# Patient Record
Sex: Male | Born: 1975 | Race: White | Hispanic: No | Marital: Married | Smoking: Former smoker
Health system: Southern US, Community
[De-identification: ages and names within clinical notes are randomized; demographics above are authoritative.]

## PROBLEM LIST (undated history)

## (undated) DIAGNOSIS — F419 Anxiety disorder, unspecified: Secondary | ICD-10-CM

## (undated) DIAGNOSIS — I517 Cardiomegaly: Secondary | ICD-10-CM

## (undated) DIAGNOSIS — K219 Gastro-esophageal reflux disease without esophagitis: Secondary | ICD-10-CM

## (undated) DIAGNOSIS — F32A Depression, unspecified: Secondary | ICD-10-CM

## (undated) DIAGNOSIS — I340 Nonrheumatic mitral (valve) insufficiency: Secondary | ICD-10-CM

## (undated) DIAGNOSIS — E8801 Alpha-1-antitrypsin deficiency: Secondary | ICD-10-CM

## (undated) DIAGNOSIS — T7840XA Allergy, unspecified, initial encounter: Secondary | ICD-10-CM

## (undated) DIAGNOSIS — J449 Chronic obstructive pulmonary disease, unspecified: Secondary | ICD-10-CM

## (undated) DIAGNOSIS — F329 Major depressive disorder, single episode, unspecified: Secondary | ICD-10-CM

## (undated) DIAGNOSIS — R0600 Dyspnea, unspecified: Secondary | ICD-10-CM

## (undated) DIAGNOSIS — I10 Essential (primary) hypertension: Secondary | ICD-10-CM

## (undated) DIAGNOSIS — G43909 Migraine, unspecified, not intractable, without status migrainosus: Secondary | ICD-10-CM

## (undated) HISTORY — PX: EYE SURGERY: SHX253

## (undated) HISTORY — DX: Essential (primary) hypertension: I10

## (undated) HISTORY — PX: HERNIA REPAIR: SHX51

## (undated) HISTORY — DX: Allergy, unspecified, initial encounter: T78.40XA

## (undated) HISTORY — DX: Major depressive disorder, single episode, unspecified: F32.9

## (undated) HISTORY — DX: Migraine, unspecified, not intractable, without status migrainosus: G43.909

## (undated) HISTORY — DX: Depression, unspecified: F32.A

## (undated) HISTORY — DX: Gastro-esophageal reflux disease without esophagitis: K21.9

---

## 2002-06-18 ENCOUNTER — Emergency Department (HOSPITAL_COMMUNITY): Admission: EM | Admit: 2002-06-18 | Discharge: 2002-06-18 | Payer: Self-pay | Admitting: Emergency Medicine

## 2010-11-22 HISTORY — PX: CHOLECYSTECTOMY: SHX55

## 2010-11-22 HISTORY — PX: ROUX-EN-Y PROCEDURE: SUR1287

## 2011-11-23 HISTORY — PX: STRABISMUS SURGERY: SHX218

## 2013-05-17 ENCOUNTER — Ambulatory Visit: Payer: Self-pay | Admitting: Ophthalmology

## 2013-05-17 LAB — CREATININE, SERUM
Creatinine: 0.88 mg/dL (ref 0.60–1.30)
EGFR (African American): >60
EGFR (Non-African Amer.): >60

## 2014-01-26 ENCOUNTER — Ambulatory Visit: Payer: Self-pay | Admitting: Orthopedic Surgery

## 2014-01-26 ENCOUNTER — Inpatient Hospital Stay: Payer: Self-pay | Admitting: Orthopedic Surgery

## 2014-01-26 LAB — COMPREHENSIVE METABOLIC PANEL
ALBUMIN: 3.4 g/dL (ref 3.4–5.0)
ALK PHOS: 143 U/L — AB
ALT: 82 U/L — AB (ref 12–78)
Anion Gap: 3 — ABNORMAL LOW (ref 7–16)
BUN: 15 mg/dL (ref 7–18)
Bilirubin,Total: 0.3 mg/dL (ref 0.2–1.0)
CALCIUM: 8.5 mg/dL (ref 8.5–10.1)
CREATININE: 0.91 mg/dL (ref 0.60–1.30)
Chloride: 108 mmol/L — ABNORMAL HIGH (ref 98–107)
Co2: 28 mmol/L (ref 21–32)
EGFR (African American): >60
EGFR (Non-African Amer.): >60
Glucose: 71 mg/dL (ref 65–99)
Osmolality: 277 (ref 275–301)
Potassium: 4 mmol/L (ref 3.5–5.1)
SGOT(AST): 60 U/L — ABNORMAL HIGH (ref 15–37)
Sodium: 139 mmol/L (ref 136–145)
Total Protein: 6.7 g/dL (ref 6.4–8.2)

## 2014-01-26 LAB — CBC WITH DIFFERENTIAL/PLATELET
BASOS ABS: 0.3 10*3/uL — AB (ref 0.0–0.1)
Basophil %: 2.3 %
EOS PCT: 0.7 %
Eosinophil #: 0.1 10*3/uL (ref 0.0–0.7)
HCT: 43.3 % (ref 40.0–52.0)
HGB: 15 g/dL (ref 13.0–18.0)
LYMPHS PCT: 11.1 %
Lymphocyte #: 1.4 10*3/uL (ref 1.0–3.6)
MCH: 30.5 pg (ref 26.0–34.0)
MCHC: 34.7 g/dL (ref 32.0–36.0)
MCV: 88 fL (ref 80–100)
Monocyte #: 0.9 x10 3/mm (ref 0.2–1.0)
Monocyte %: 7.1 %
Neutrophil #: 9.7 10*3/uL — ABNORMAL HIGH (ref 1.4–6.5)
Neutrophil %: 78.8 %
Platelet: 231 10*3/uL (ref 150–440)
RBC: 4.94 10*6/uL (ref 4.40–5.90)
RDW: 14.2 % (ref 11.5–14.5)
WBC: 12.3 10*3/uL — AB (ref 3.8–10.6)

## 2014-01-28 LAB — CBC WITH DIFFERENTIAL/PLATELET
BASOS PCT: 0.5 %
Basophil #: 0 10*3/uL (ref 0.0–0.1)
Eosinophil #: 0.1 10*3/uL (ref 0.0–0.7)
Eosinophil %: 1.3 %
HCT: 39.4 % — ABNORMAL LOW (ref 40.0–52.0)
HGB: 13.5 g/dL (ref 13.0–18.0)
Lymphocyte #: 1.7 10*3/uL (ref 1.0–3.6)
Lymphocyte %: 19.1 %
MCH: 30.1 pg (ref 26.0–34.0)
MCHC: 34.1 g/dL (ref 32.0–36.0)
MCV: 88 fL (ref 80–100)
MONOS PCT: 11.8 %
Monocyte #: 1 x10 3/mm (ref 0.2–1.0)
NEUTROS PCT: 67.3 %
Neutrophil #: 6 10*3/uL (ref 1.4–6.5)
Platelet: 207 10*3/uL (ref 150–440)
RBC: 4.46 10*6/uL (ref 4.40–5.90)
RDW: 13.5 % (ref 11.5–14.5)
WBC: 8.9 10*3/uL (ref 3.8–10.6)

## 2014-01-28 LAB — VANCOMYCIN, TROUGH: Vancomycin, Trough: 18 ug/mL (ref 10–20)

## 2014-01-30 LAB — WOUND CULTURE

## 2014-01-31 LAB — CULTURE, BLOOD (SINGLE)

## 2015-03-15 NOTE — Op Note (Signed)
PATIENT NAME:  Derrick Doyle, Derrick Doyle MR#:  [id number] DATE OF BIRTH:  Nov 09, 1976  DATE OF PROCEDURE:  01/26/2014  PREOPERATIVE DIAGNOSES:  1.  Right hand abscess/middle finger flexor tenosynovitis.  2.  Acute carpal tunnel syndrome, right hand.   POSTOPERATIVE DIAGNOSES: 1.  Right hand abscess/middle finger flexor tenosynovitis.  2.  Acute carpal tunnel syndrome, right hand.   PROCEDURE PERFORMED:  1,  Right hand/middle finger incision and drainage.  2.  Open carpal tunnel release.   SURGEON: Maebelle Munroe, M.D.   ASSISTANT: None.   COMPLICATIONS: None apparent.   ESTIMATED BLOOD LOSS: Less than 20 mL.   ANESTHESIA: General.   MATERIALS TO LAB: Anaerobic, aerobic cultures and Gram stain x 2.   OPERATIVE FINDINGS: Moderate purulence flexor tendon sheath and minimal purulence carpal tunnel.   INDICATIONS: Mr. Bougie is a 39 year old male, who approximately 10 days ago underwent an office-based trigger finger release by Dr. Tamala Julian. Unfortunately, the patient  developed redness and swelling, which upon an examination in the Emergency Department was consistent with flexor tenosynovitis of the middle finger and hand abscess with all Knavel's signs present 4 out of 4 and acute carpal tunnel syndrome; risks, benefits and alternatives were discussed with the patient and his wife to include, but not limited to bleeding, infection, damage to blood vessels and nerves, need for further surgery and treatment, chronic pain, loss of function, stiffness, allergy, anesthetic risk, recurrence of infection and hand stiffness. He appeared to understand the risks, benefits and desired to proceed with operative treatment.   DESCRIPTION OF PROCEDURE: After positive identification of the patient in the preoperative holding and after informed consent had been obtained, the correct operative site had been initialed by myself. The patient was taken to the operating room and placed in the supine position. IV  antibiotics were held. Timeout was performed. Sequential compression devices were applied to bilateral lower extremities. The patient started a general anesthesia. The tourniquet was placed on the right brachium. The right upper extremity was prepped and draped in the usual sterile fashion. The arm was elevated with elevation and exsanguination. Tourniquet pressure was set to 250 mmHg desufflated at the end of procedure for a total tourniquet time of 90 minutes. The carpal tunnel release was performed initially given the presence of acute carpal tunnel syndrome. A longitudinal incision was made at the junction of the middle and ring finger ray proximal to Kaplan's line. Incision was deepened in the Gantt fascia, which was incised in line with the skin incision. This was then deepened to the transverse carpal ligament. The distal most aspect of the transverse carpal ligament was identified and confirmed with the peri-arterial fat of the superficial palmar arch. The transverse carpal ligament was then released from distal to proximal. The median nerve was identified and protected throughout the procedure. This was released under direct visualization to the point of the distal forearm fascia. Attention was then directed toward the trigger finger incision, which was then extended proximally using a Brunner style incision and distally using a Brunner incision over the middle and distal phalanx using an ulnar base.   The Soil scientist was then used to connect the carpal tunnel with the trigger finger incision where moderate purulence was noted. The purulence was noted to exude also from the distal phalanx. Bruner incision was extended to the distal phalanx. I used a 5-French pediatric feeding tube with 22-gauge surgical wire in an attempt to pass the pediatric feeding tube with slits in it to  irrigate the carpal tunnel and flexor tendon sheath. Given the inability to pass, I was able to pass the catheter from the  carpal tunnel incision through the trigger finger incision, but not into the finger incision. The Brunner incision was then extended to the trigger finger incision and out onto the distal phalanx. The pediatric feeding tube with slits in it was then utilized for sprinkler type irrigation in the flexor tendon sheath along with bulb irrigation overall into the wound. Approximately 1500 mL of fluid were utilized. Hemostasis was obtained. The radial and ulnar digital nerves were identified in the middle finger prior to incision of the flexor tendon sheath, which was mainly over the A3 and A5 pulleys. The Brunner incision was closed with 3-0 nylon in a running fashion. The carpal tunnel was closed in a standard fashion and the skin with 3-0 nylon. The incision over the initial trigger finger release was loosely closed. The devitalized, infected skin was debrided, especially from the distal portion of the incision. Xeroform dressings with 4 x 4 and gauze along with dorsal splint were then placed. The tourniquet was deflated. The patient was taken to the recovery room in satisfactory condition after extubation without apparent operative anesthetic complications. These findings were related to the patient's wife as well as his need for followup, compliance and IV antibiotics.    ____________________________ Maebelle Munroe, MD jfs:aw D: 01/27/2014 00:02:46 ET T: 01/27/2014 11:38:29 ET JOB#: [id number]  cc: Maebelle Munroe, MD, <Dictator> Maebelle Munroe MD ELECTRONICALLY SIGNED 01/27/2014 19:59

## 2015-03-15 NOTE — Discharge Summary (Signed)
PATIENT NAME:  Derrick Doyle, Derrick Doyle MR#:  [id number] DATE OF BIRTH:  11-Apr-1976  DATE OF ADMISSION:  01/26/2014 DATE OF DISCHARGE:  01/29/2014  ADMITTING DIAGNOSES:  1. Right hand abscess/middle finger flexor tendinitis. 2. Acute carpal tunnel syndrome felt to be secondary to abscess pressure from the flexor tendon sheath.   DISCHARGE DIAGNOSES:  1. Right hand abscess/middle finger flexor tendinitis. 2. Acute carpal tunnel syndrome felt to be secondary to abscess pressure from the flexor tendon sheath.   HISTORY OF PRESENT ILLNESS: The patient is a 39 year old who presented to the Emergency Room having increased discomfort to the right-hand along with swelling. 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 Margaretmary Eddy. The patient was seen in the office on the Thursday prior to admission, at that time, the concern was that  he had an infection. He was placed on Keflex. He continued to have pain and swelling and contacted [employer] on Friday. He then was seen in the emergency room on Saturday due to the increased pain and swelling. Dr. Christophe Louis was contacted by the Emergency Room  physician and was advised that he needed to come in the office Monday morning. Subsequently, the Emergency Room doctor contacted the locum doctor on call, Dr. Mardi Mainland, who saw the patient and felt that he needed emergency surgery. He was therefore admitted in the orthopedic department and underwent surgery.   PROCEDURE:  1. Right hand middle finger incision and drainage.  2. Open carpal tunnel release:   ANESTHESIA: General.   HOSPITAL COURSE: The patient tolerated the procedure very well. He had no complications. He was then taken to the PAC-U where he was stabilized and then transferred to the orthopedic floor. The patient was placed on cephalexin, as well as Unasyn. He continue to improve.  He still had a lot of of discomfort and swelling. He was afebrile throughout the hospital course. Cultures and sensitivities were performed and the patient is discharged after sensitivity was obtained. He was noted to be be sensitive to Bactrim DS and subsequently he was discharged to home in improved condition with this. He is placed on a Bactrim DS for two weeks. He will need to follow-up in the clinic in one week. Please call the clinic sooner if any temperatures to 101.5 or greater or excessive bleeding or increase in numbness and tingling or burning sensation. He was instructed in elevation of the right arm. Elevate the arm as much as possible. Also apply ice. He is to keep the wound clean and dry. He is to resume his regular medications that he was on prior to admission. He was given a prescription for oxycodone 5 to 10 mg every 4 to 6 hours p.r.n. for pain, as well as Tramadol 50 to 100 mg every 4 to 6 hours p.r.n. for pain. He is placed on a regular diet.   Past medical history is positive for anxiety disorder, depression, and hypertension.  ____________________________ Vance Peper, PA jrw:sg D: 01/29/2014 12:20:37 ET T: 01/29/2014 12:50:55 ET JOB#: [id number]  cc: Vance Peper, PA, <Dictator> JON WOLFE PA ELECTRONICALLY SIGNED 02/07/2014 18:33

## 2015-03-15 NOTE — H&P (Signed)
PATIENT NAME:  Derrick Doyle, Derrick Doyle MR#:  [id number] DATE OF BIRTH:  12/17/1975  DATE OF ADMISSION:  01/26/2014  CHIEF COMPLAINT:  Right hand pain.   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 Margaretmary Eddy.  He states that he was seen in the office by Dr. Tamala Julian on Thursday.  At that time he had swelling of the hand as well as pain which ultimately progressed yesterday and per the patient's history they telephoned the office and he was told to take Keflex.    The patient's pain, swelling and redness progressed today causing him to present to the Emergency Department.  I was contacted by Dr. Jasmine December to see the patient.  The patient complains of sharp, near constant, throbbing pain in the right hand as well as drainage.   PAST MEDICAL HISTORY:  Remarkable for anxiety disorder, depression, and hypertension.   PAST SURGICAL HISTORY:  Remarkable for gastric bypass, office-based trigger finger release.   CURRENT MEDICATIONS:  Include sertraline, keflex, lamotrigine, losartan, mobic, trazodone  CURRENT ALLERGIES:  Include no known drug allergies.   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  He is right-hand dominant.  He is in the process of quitting smoking.  He is smoking approximately three cigarettes per day.  Occasional alcohol use.   PHYSICAL EXAMINATION: GENERAL:  Pleasant, alert male appearing his stated age, presenting with his wife.  PSYCHIATRIC:  Mood and affect appropriate.  HEENT:  Normocephalic, atraumatic.  Sclerae clear.  Oral mucosa membranes are moist.  VITAL SIGNS:  On presentation to the Emergency Department, temperature of 98.2, pulse of 108, blood pressure 176/102, 99% room air saturation.  LUNGS:  Clear to auscultation bilaterally.  HEART:  Regular rhythm, increased rate, no murmurs or gallops.   ABDOMEN:  Soft.  Positive bowel sounds.  LYMPHATIC:  Moderate swelling.   RIGHT HAND SKIN EXAMINATION:  Skin defect over the surgical site and the palm with diffuse erythema and swelling of the right ring finger ray.  VASCULAR:  Less than 2 second capillary refill right middle finger.  NEUROLOGIC:  Shows diffuse loss of light touch sensation predominantly in the median nerve distribution.  Motor function is intact.  There is also decreased light touch sensation in the ulnar nerve distribution as well to a much lesser degree.   MUSCULOSKELETAL:  Marked fusiform swelling of the right middle finger with erythema and flex posture of his finger with significant pain with any extension of the middle finger.  There is marked tenderness to palpation both over the carpal tunnel as well as the entire course of the middle finger ray volarly including the proximal middle and distal phalanx as well as over the PIP and DIPJ.  Surgical site itself shows emacerated skin with expressible cloudy discharge and severe tenderness to palpation in this region.  Knavel's signs are all presnent DIAGNOSTIC STUDIES:  Laboratory evaluation shows normal chemistries and slightly elevated ALT and AST.  On CBC white blood cell count increased at 12.3 with a left shift of 78.8% neutrophils.  Radiographs are unremarkable except for soft tissue swelling in the right hand.   IMPRESSION:  1.  Right hand abscess/middle finger suppurative flexor tenosynovitis postoperative.  2.  Acute carpal tunnel syndrome, most likely secondary to abscess pressure from the       flexor tendon sheath.   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  Risks, benefits and alternatives were discussed with the patient to include, but not limited to bleeding, infection, damage to blood vessels and nerves, need for surgery and  treatment, chronic pain, loss of function, stiffness, allergy, anesthetic risk, recurrence of infection, stiffness, heart, lung, brain, kidney complications.  The patient and his wife appeared to understand the risks and benefits and desired to proceed with operative treatment.  The patient has received IV antibiotics here in the Emergency Department.  We will also add a C-reactive protein to his lab draw and the patient and his wife were informed that he will need to be admitted for IV antibiotics following his procedure.      ____________________________ Maebelle Munroe, MD jfs:ea D: 01/26/2014 20:29:18 ET T: 01/26/2014 23:51:46 ET JOB#: [id number]  cc: Maebelle Munroe, MD, <Dictator> Maebelle Munroe MD ELECTRONICALLY SIGNED 01/27/2014 19:57

## 2015-06-16 ENCOUNTER — Ambulatory Visit
Admission: RE | Admit: 2015-06-16 | Discharge: 2015-06-16 | Disposition: A | Discharge status: Home / Self Care | Payer: BLUE CROSS/BLUE SHIELD | Source: Ambulatory Visit | Attending: Cardiology | Admitting: Cardiology

## 2015-06-16 ENCOUNTER — Other Ambulatory Visit: Payer: Self-pay | Admitting: Cardiology

## 2015-06-16 DIAGNOSIS — R059 Cough, unspecified: Secondary | ICD-10-CM

## 2015-06-16 DIAGNOSIS — R05 Cough: Secondary | ICD-10-CM

## 2015-07-02 DIAGNOSIS — G5600 Carpal tunnel syndrome, unspecified upper limb: Secondary | ICD-10-CM | POA: Insufficient documentation

## 2015-07-02 DIAGNOSIS — R200 Anesthesia of skin: Secondary | ICD-10-CM | POA: Insufficient documentation

## 2015-07-18 DIAGNOSIS — M25649 Stiffness of unspecified hand, not elsewhere classified: Secondary | ICD-10-CM | POA: Insufficient documentation

## 2015-12-25 ENCOUNTER — Ambulatory Visit (INDEPENDENT_AMBULATORY_CARE_PROVIDER_SITE_OTHER): Payer: Managed Care, Other (non HMO) | Admitting: Family Medicine

## 2015-12-25 ENCOUNTER — Encounter: Payer: Self-pay | Admitting: Family Medicine

## 2015-12-25 VITALS — BP 142/86 | HR 124 | Temp 102.3°F | Resp 18 | Ht 66.0 in | Wt 230.2 lb

## 2015-12-25 DIAGNOSIS — Z9884 Bariatric surgery status: Secondary | ICD-10-CM | POA: Diagnosis not present

## 2015-12-25 DIAGNOSIS — H66002 Acute suppurative otitis media without spontaneous rupture of ear drum, left ear: Secondary | ICD-10-CM

## 2015-12-25 DIAGNOSIS — R509 Fever, unspecified: Secondary | ICD-10-CM | POA: Diagnosis not present

## 2015-12-25 DIAGNOSIS — I10 Essential (primary) hypertension: Secondary | ICD-10-CM

## 2015-12-25 DIAGNOSIS — J029 Acute pharyngitis, unspecified: Secondary | ICD-10-CM | POA: Diagnosis not present

## 2015-12-25 DIAGNOSIS — R110 Nausea: Secondary | ICD-10-CM | POA: Diagnosis not present

## 2015-12-25 LAB — POCT RAPID STREP A (OFFICE): Rapid Strep A Screen: NEGATIVE

## 2015-12-25 LAB — POCT INFLUENZA A/B
INFLUENZA B, POC: NEGATIVE
Influenza A, POC: NEGATIVE

## 2015-12-25 MED ORDER — IBUPROFEN 400 MG PO TABS: 400.0000 mg | ORAL_TABLET | Freq: Four times a day (QID) | ORAL | Status: DC | PRN

## 2015-12-25 MED ORDER — AMOXICILLIN 250 MG/5ML PO SUSR: 500.0000 mg | Freq: Two times a day (BID) | ORAL | Status: AC

## 2015-12-25 MED ORDER — ONDANSETRON HCL 4 MG PO TABS: 4.0000 mg | ORAL_TABLET | Freq: Three times a day (TID) | ORAL | Status: AC | PRN

## 2015-12-25 MED ORDER — ACETAMINOPHEN 500 MG PO TABS: 500.0000 mg | ORAL_TABLET | Freq: Four times a day (QID) | ORAL | Status: DC | PRN

## 2015-12-25 NOTE — Assessment & Plan Note (Signed)
Liquid antibiotic given to ease in swallowing pills.

## 2015-12-25 NOTE — Assessment & Plan Note (Signed)
BP elevated due to illness. Recheck at a well visit.

## 2015-12-25 NOTE — Patient Instructions (Signed)

## 2015-12-25 NOTE — Progress Notes (Signed)
Subjective:    Patient ID: Derrick Doyle, male    DOB: [birth year], 40 y.o.   MRN: [id number]  HPI: Derrick Doyle is a 40 y.o. male presenting on 12/25/2015 for Fever   HPI  New patient sick visit. Previous Dr. Rebecka Apley. Records will be requested and reviewed.  Pt presents for fever, body aches, HA, dryheaves and diarrhea. Symptoms began about 1 week ago but worsened on Tuesday. Sore throat. Trouble swallowing. Dry heaves this morning. Able to keep down medications. Not wanting to eat. 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     Past Medical History  Diagnosis Date  . Hypertension   . Depression   . Migraines   . GERD (gastroesophageal reflux disease)    Past Surgical History  Procedure Laterality Date  . Roux-en-y procedure  2012    Social History   Social History  . Marital Status: Married    Spouse Name: N/A  . Number of Children: N/A  . Years of Education: N/A   Occupational History  . Not on file.   Social History Main Topics  . Smoking status: Never Smoker   . Smokeless tobacco: Not on file  . Alcohol Use: No  . Drug Use: No  . Sexual Activity: Not on file   Other Topics Concern  . Not on file   Social History Narrative  . No narrative on file   No family history on file. No current outpatient prescriptions on file prior to visit.   No current facility-administered medications on file prior to visit.    Review of Systems  Constitutional: Positive for fever and chills.  HENT: Positive for rhinorrhea and sore throat.   Respiratory: Positive for cough. Negative for chest tightness, shortness of breath and wheezing.   Cardiovascular: Negative for chest pain, palpitations and leg swelling.  Gastrointestinal: Positive for nausea, vomiting and diarrhea.  Musculoskeletal: Positive for myalgias.  Neurological: Positive for headaches.   Per HPI unless specifically indicated above     Objective:    BP 142/86 mmHg  Pulse 124  Temp(Src) 102.3  F (39.1 C) (Oral)  Resp 18  Ht 5\' 6"  (1.676 m)  Wt 230 lb 3.2 oz (104.418 kg)  BMI 37.17 kg/m2  SpO2 100%  Wt Readings from Last 3 Encounters:  12/25/15 230 lb 3.2 oz (104.418 kg)    Physical Exam  Constitutional: He is oriented to person, place, and time. He appears well-developed and well-nourished. He is cooperative. He appears ill.  HENT:  Head: Normocephalic and atraumatic.  Right Ear: Hearing normal.  Left Ear: Hearing normal. Tympanic membrane is erythematous and bulging.  Nose: No mucosal edema or rhinorrhea. Right sinus exhibits no maxillary sinus tenderness and no frontal sinus tenderness. Left sinus exhibits no maxillary sinus tenderness and no frontal sinus tenderness.  Mouth/Throat: Mucous membranes are normal. Oropharyngeal exudate and posterior oropharyngeal erythema (beefy red with petechaie.) present.  Neck: Normal range of motion. Neck supple. No Brudzinski's sign and no Kernig's sign noted.  Cardiovascular: Normal rate and regular rhythm.  Exam reveals no gallop and no friction rub.   No murmur heard. Pulmonary/Chest: Effort normal and breath sounds normal. He has no wheezes. He exhibits no tenderness.  Lymphadenopathy:    He has cervical adenopathy.       Right cervical: Superficial cervical adenopathy present.       Left cervical: Superficial cervical adenopathy present.  Neurological: He is alert and oriented to person, place, and time.  Psychiatric: He has  a normal mood and affect. His behavior is normal. Judgment and thought content normal.   Results for orders placed or performed in visit on 12/25/15  POCT rapid strep A  Result Value Ref Range   Rapid Strep A Screen Negative Negative  POCT Influenza A/B  Result Value Ref Range   Influenza A, POC Negative Negative   Influenza B, POC Negative Negative      Assessment & Plan:   Problem List Items Addressed This Visit      Cardiovascular and Mediastinum   Hypertension    BP elevated due to illness.  Recheck at a well visit.       Relevant Medications   losartan (COZAAR) 100 MG tablet   EPIPEN 2-PAK 0.3 MG/0.3ML SOAJ injection     Other   S/P gastric bypass    Liquid antibiotic given to ease in swallowing pills.        Other Visit Diagnoses    Pharyngitis    -  Primary    Treat for strep given exudate, beefy red throat, and petechaie. +AC nodes. Amoxil 500mg  BID for 10 days  10 days to treat both ear and strep. Supportive care at home. Alarm symptoms reviewed. Return if not improving.     Relevant Orders    POCT rapid strep A (Completed)    Culture, Group A Strep    Fever and chills        Relevant Medications    acetaminophen (TYLENOL) 500 MG tablet    Other Relevant Orders    POCT Influenza A/B (Completed)    Nausea        Zofran to help with nausea. Stressed importance of fluid intake. Avoid foods until feeling better.     Relevant Medications    ondansetron (ZOFRAN) 4 MG tablet    Acute suppurative otitis media of left ear without spontaneous rupture of tympanic membrane, recurrence not specified        Treat with Amoxil 500 mg BID. Advil and tylenol PRN for pain. Supportive care at home.     Relevant Medications    amoxicillin (AMOXIL) 250 MG/5ML suspension       Meds ordered this encounter  Medications  . sertraline (ZOLOFT) 50 MG tablet    Sig: Take 50 mg by mouth.  Marland Kitchen UNKNOWN TO PATIENT    Sig:   . losartan (COZAAR) 100 MG tablet    Sig: Take 100 mg by mouth.  . mometasone (NASONEX) 50 MCG/ACT nasal spray    Sig: 2 sprays by Each Nare route Two (2) times a day.  Marland Kitchen EPIPEN 2-PAK 0.3 MG/0.3ML SOAJ injection    Sig: use as directed by prescriber    Refill:  0  . amoxicillin (AMOXIL) 250 MG/5ML suspension    Sig: Take 10 mLs (500 mg total) by mouth 2 (two) times daily.    Dispense:  200 mL    Refill:  0    Order Specific Question:  Supervising Provider    Answer:  Arlis Porta [id number]  . ondansetron (ZOFRAN) 4 MG tablet    Sig: Take 1 tablet (4 mg  total) by mouth every 8 (eight) hours as needed for nausea or vomiting.    Dispense:  20 tablet    Refill:  0    Order Specific Question:  Supervising Provider    Answer:  Arlis Porta [phone]  . ibuprofen (ADVIL,MOTRIN) 400 MG tablet    Sig: Take 1 tablet (400 mg total)  by mouth every 6 (six) hours as needed.    Dispense:  30 tablet    Refill:  0    Order Specific Question:  Supervising Provider    Answer:  Arlis Porta [phone]  . acetaminophen (TYLENOL) 500 MG tablet    Sig: Take 1 tablet (500 mg total) by mouth every 6 (six) hours as needed.    Dispense:  30 tablet    Refill:  0    Order Specific Question:  Supervising Provider    Answer:  Arlis Porta [id number]      Follow up plan: Return if symptoms worsen or fail to improve.

## 2015-12-28 LAB — CULTURE, GROUP A STREP: STREP A CULTURE: NEGATIVE

## 2015-12-30 ENCOUNTER — Telehealth: Payer: Self-pay | Admitting: Family Medicine

## 2015-12-30 NOTE — Telephone Encounter (Signed)
Pt return your call  Pt call back # is  [phone]

## 2016-01-22 ENCOUNTER — Ambulatory Visit (INDEPENDENT_AMBULATORY_CARE_PROVIDER_SITE_OTHER): Payer: Managed Care, Other (non HMO) | Admitting: Family Medicine

## 2016-01-22 ENCOUNTER — Encounter: Payer: Self-pay | Admitting: Family Medicine

## 2016-01-22 VITALS — BP 128/86 | HR 84 | Temp 98.1°F | Resp 16 | Ht 66.0 in | Wt 230.0 lb

## 2016-01-22 DIAGNOSIS — F32A Depression, unspecified: Secondary | ICD-10-CM

## 2016-01-22 DIAGNOSIS — F418 Other specified anxiety disorders: Secondary | ICD-10-CM

## 2016-01-22 DIAGNOSIS — Z720 Tobacco use: Secondary | ICD-10-CM

## 2016-01-22 DIAGNOSIS — I10 Essential (primary) hypertension: Secondary | ICD-10-CM | POA: Diagnosis not present

## 2016-01-22 DIAGNOSIS — Z9884 Bariatric surgery status: Secondary | ICD-10-CM

## 2016-01-22 DIAGNOSIS — Z91048 Other nonmedicinal substance allergy status: Secondary | ICD-10-CM | POA: Diagnosis not present

## 2016-01-22 DIAGNOSIS — F329 Major depressive disorder, single episode, unspecified: Secondary | ICD-10-CM

## 2016-01-22 DIAGNOSIS — G43009 Migraine without aura, not intractable, without status migrainosus: Secondary | ICD-10-CM | POA: Diagnosis not present

## 2016-01-22 DIAGNOSIS — F419 Anxiety disorder, unspecified: Secondary | ICD-10-CM

## 2016-01-22 DIAGNOSIS — Z7189 Other specified counseling: Secondary | ICD-10-CM | POA: Diagnosis not present

## 2016-01-22 DIAGNOSIS — Z9109 Other allergy status, other than to drugs and biological substances: Secondary | ICD-10-CM

## 2016-01-22 DIAGNOSIS — M545 Low back pain, unspecified: Secondary | ICD-10-CM | POA: Insufficient documentation

## 2016-01-22 DIAGNOSIS — J301 Allergic rhinitis due to pollen: Secondary | ICD-10-CM

## 2016-01-22 DIAGNOSIS — Z7689 Persons encountering health services in other specified circumstances: Secondary | ICD-10-CM

## 2016-01-22 DIAGNOSIS — J309 Allergic rhinitis, unspecified: Secondary | ICD-10-CM | POA: Insufficient documentation

## 2016-01-22 MED ORDER — IBUPROFEN 400 MG PO TABS: 400.0000 mg | ORAL_TABLET | Freq: Four times a day (QID) | ORAL | Status: DC | PRN

## 2016-01-22 MED ORDER — CYCLOBENZAPRINE HCL 10 MG PO TABS: 10.0000 mg | ORAL_TABLET | Freq: Three times a day (TID) | ORAL | Status: DC | PRN

## 2016-01-22 MED ORDER — ESCITALOPRAM OXALATE 10 MG PO TABS: 10.0000 mg | ORAL_TABLET | Freq: Every day | ORAL | Status: DC

## 2016-01-22 NOTE — Assessment & Plan Note (Signed)
Pt is ready to quit but wants to wait until his mood is better. Have encouraged use of nicorette gum to help with Dip.

## 2016-01-22 NOTE — Progress Notes (Signed)
Subjective:    Patient ID: Derrick Doyle, male    DOB: 09-May-1976, 40 y.o.   MRN: [id number]  HPI: Derrick Doyle is a 40 y.o. male presenting on 01/22/2016 for Establish Care   HPI  Pt presents to establish care today. Previous care provider was Mikeal Hawthorne with Dr. Lavera Guise.  It has been 6 months since his last PCP visit. Records from previous provider will be requested and reviewed. 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 Current medical problems include:  HTN: since 40 years old. Was taking losartan, but ran out of it 6 weeks ago. Takes BP at home: runs 110/70's when at home, and at work 140/90. He has occasional HA's and checks his blood pressure at this time, but it is not always high. No CP, SOB, swelling, lightheadedness, dizziness.  Depression/Anxiety: has been off zoloft for 2 weeks because he ran out. He doesn't like the zoloft and feels like it gives him migraines. He has tried Brewing technologist in the past, but it made him nauseated. Feels like his mood is affected and that he is irritable. He does not sleep well, max of 3 hours a night. He has trouble falling asleep and has tried OTC sleep aid, but it did not help. PHQ-9 score 17. Decreased appetite since surgery.   Migraines: last migraine was early February. He gets nauseated before it comes on, but no visual aura. In early January, he started going to the chiropractor to help with migraines. This did help and he went regularly for about 3 weeks, but stopped because his back pain got worse.  If he catches his migraine early on and takes enough tylenol and ibuprofen, he can prevent them from getting bad. Vomiting can also improve his migraines.   GERD: No problems since gallbladder and gastric bypass removed. Takes tums as needed.  Allergies: Dr. Dionicio Stall in [facility] was giving allergy shots and did skin tests. He has an epipen. He takes zyrtec, but rotates allergy medication once they stop helping (claratin and  zyrtec). He was getting shots twice per week. He is interested in doing this again, but would want an allergist closer than [place]. Allergies were improving with the shots. Takes nasonex 2x or more per day when congestion is bad.  Low back pain: X-ray at Urgent Care - showed that spine was straightening in curve. Per chiropractor recommendations, he has a stand up desk at work and is not lifting >10 lbs. Low back pain has been chronic, but has gotten worse since he was seeing chiropractor regularly. Standing against wall is most comfortable position. Sitting is very uncomfortable. Lower mid back pain across lower back, radiates down both legs. Ibuprofen has helped some.   Health maintenance:  Flu - declined Lipids - will draw today.    Past Medical History  Diagnosis Date  . Hypertension   . Depression   . Migraines   . GERD (gastroesophageal reflux disease)   . Allergy     Current Outpatient Prescriptions on File Prior to Visit  Medication Sig  . acetaminophen (TYLENOL) 500 MG tablet Take 1 tablet (500 mg total) by mouth every 6 (six) hours as needed.  Marland Kitchen EPIPEN 2-PAK 0.3 MG/0.3ML SOAJ injection use as directed by prescriber  . losartan (COZAAR) 100 MG tablet Take 100 mg by mouth.  . mometasone (NASONEX) 50 MCG/ACT nasal spray 2 sprays by Each Nare route Two (2) times a day.  . ondansetron (ZOFRAN) 4 MG  tablet Take 1 tablet (4 mg total) by mouth every 8 (eight) hours as needed for nausea or vomiting.  Marland Kitchen UNKNOWN TO PATIENT    No current facility-administered medications on file prior to visit.    Review of Systems  Constitutional: Positive for activity change. Negative for fever, appetite change, fatigue and unexpected weight change.  HENT: Negative for congestion, ear pain, hearing loss, postnasal drip, sinus pressure, sneezing, sore throat and trouble swallowing.   Eyes: Negative for photophobia, pain and visual disturbance.  Respiratory: Negative for cough and chest tightness.     Cardiovascular: Negative for chest pain and leg swelling.  Gastrointestinal: Negative for nausea, vomiting, abdominal pain, diarrhea and constipation.  Endocrine: Positive for cold intolerance.  Genitourinary: Negative for urgency, frequency, decreased urine volume and difficulty urinating.  Musculoskeletal: Positive for back pain. Negative for joint swelling and arthralgias.  Skin: Negative for color change.  Allergic/Immunologic: Positive for environmental allergies.  Neurological: Positive for headaches. Negative for dizziness, speech difficulty, weakness, light-headedness and numbness.  Psychiatric/Behavioral: Positive for sleep disturbance and agitation. Negative for suicidal ideas and behavioral problems. The patient is nervous/anxious.    Per HPI unless specifically indicated above     Objective:    BP 128/86 mmHg  Pulse 84  Temp(Src) 98.1 F (36.7 C) (Oral)  Resp 16  Ht 5\' 6"  (1.676 m)  Wt 230 lb (104.327 kg)  BMI 37.14 kg/m2  Wt Readings from Last 3 Encounters:  01/22/16 230 lb (104.327 kg)  12/25/15 230 lb 3.2 oz (104.418 kg)    Physical Exam  Constitutional: He is oriented to person, place, and time. He appears well-developed and well-nourished.  HENT:  Head: Normocephalic.  Neck: Normal range of motion. No thyromegaly present.  Cardiovascular: Normal rate, regular rhythm and normal heart sounds.  Exam reveals no gallop and no friction rub.   No murmur heard. Pulmonary/Chest: Effort normal and breath sounds normal.  Abdominal: Soft. Bowel sounds are normal.  Musculoskeletal:  Negative straight leg raise bilaterally  Neurological: He is alert and oriented to person, place, and time. He has normal reflexes.  Reflex Scores:      Patellar reflexes are 2+ on the right side and 2+ on the left side. Skin: Skin is warm and dry.  Psychiatric: His behavior is normal. Judgment and thought content normal. His mood appears anxious.   Results for orders placed or performed  in visit on 12/25/15  Culture, Group A Strep  Result Value Ref Range   Strep A Culture Negative   POCT rapid strep A  Result Value Ref Range   Rapid Strep A Screen Negative Negative  POCT Influenza A/B  Result Value Ref Range   Influenza A, POC Negative Negative   Influenza B, POC Negative Negative      Assessment & Plan:   Problem List Items Addressed This Visit      Cardiovascular and Mediastinum   Hypertension    Controlled without Cozaar. Will plan to hold x1 mos and f/u to discuss need for medication. Check CMET. Diet and lifestyle changes encouraged.       Relevant Orders   Comprehensive metabolic panel   Lipid panel   Migraine without aura    Controlled without zoloft. Will plan to initiate prophylaxis if they increase. Otherwise home treatment.       Relevant Medications   escitalopram (LEXAPRO) 10 MG tablet   ibuprofen (ADVIL,MOTRIN) 400 MG tablet   cyclobenzaprine (FLEXERIL) 10 MG tablet     Respiratory  Allergic rhinitis    Continue mometasone for allergy symptoms. Have encouraged not to use more than directed. Continue to follow directions of allergist. New referral to [place] [facility] asthma placed today.       Relevant Orders   Comprehensive metabolic panel   Ambulatory referral to Allergy     Other   S/P gastric bypass   Low back pain    NSAids and muscle relaxants to help with pain. Refer to PT- script for stewarts given.  Return if symptoms are not improving.       Relevant Medications   ibuprofen (ADVIL,MOTRIN) 400 MG tablet   cyclobenzaprine (FLEXERIL) 10 MG tablet   Anxiety and depression    Change to lexapro from zoloft.  Recheck 1 mos. Check TSH and vitamin D.       Relevant Medications   escitalopram (LEXAPRO) 10 MG tablet   Other Relevant Orders   VITAMIN D 25 Hydroxy (Vit-D Deficiency, Fractures)   TSH   Tobacco use    Pt is ready to quit but wants to wait until his mood is better. Have encouraged use of nicorette gum to help  with Dip.        Other Visit Diagnoses    Encounter to establish care    -  Primary    Multiple environmental allergies        Relevant Orders    Ambulatory referral to Allergy       Meds ordered this encounter  Medications  . escitalopram (LEXAPRO) 10 MG tablet    Sig: Take 1 tablet (10 mg total) by mouth daily.    Dispense:  30 tablet    Refill:  11    Order Specific Question:  Supervising Provider    Answer:  Arlis Porta [phone]  . ibuprofen (ADVIL,MOTRIN) 400 MG tablet    Sig: Take 1 tablet (400 mg total) by mouth every 6 (six) hours as needed.    Dispense:  30 tablet    Refill:  0    Order Specific Question:  Supervising Provider    Answer:  Arlis Porta [phone]  . cyclobenzaprine (FLEXERIL) 10 MG tablet    Sig: Take 1 tablet (10 mg total) by mouth 3 (three) times daily as needed for muscle spasms.    Dispense:  30 tablet    Refill:  0    Order Specific Question:  Supervising Provider    Answer:  Arlis Porta [phone]      Follow up plan: Return in about 4 weeks (around 02/19/2016) for BP check. Marland Kitchen

## 2016-01-22 NOTE — Assessment & Plan Note (Signed)
Change to lexapro from zoloft.  Recheck 1 mos. Check TSH and vitamin D.

## 2016-01-22 NOTE — Assessment & Plan Note (Signed)
Controlled without zoloft. Will plan to initiate prophylaxis if they increase. Otherwise home treatment.

## 2016-01-22 NOTE — Assessment & Plan Note (Signed)
Controlled without Cozaar. Will plan to hold x1 mos and f/u to discuss need for medication. Check CMET. Diet and lifestyle changes encouraged.

## 2016-01-22 NOTE — Patient Instructions (Addendum)
Depression/ Anxiety: Start escitalopram 10 mg nightly. Take 1/2 tablet for 4 days to help reduce side effects.  Then resume full dose.  Allergies: We have referred you to an allergist in [place]. Someone should contact you with an appt.   Back Pain: Try physical therapy to help with pain management. Continue advil as needed and try flexeril for back pain. This can make you sleepy so just take a bedtime.   If you develop progressive numbness, weakness, loss of feeling in your pelvis, or loss of bowel/bladder control please seek immediate medical attention.

## 2016-01-22 NOTE — Assessment & Plan Note (Signed)
Continue mometasone for allergy symptoms. Have encouraged not to use more than directed. Continue to follow directions of allergist. New referral to [place] [facility] asthma placed today.

## 2016-01-22 NOTE — Assessment & Plan Note (Signed)
NSAids and muscle relaxants to help with pain. Refer to PT- script for stewarts given.  Return if symptoms are not improving.

## 2016-01-28 ENCOUNTER — Telehealth: Payer: Self-pay | Admitting: Family Medicine

## 2016-01-28 NOTE — Telephone Encounter (Signed)
LMTCB: pt forms he sent over are incomplete. He needs to resend them to the office. Thanks! AK

## 2016-02-19 ENCOUNTER — Ambulatory Visit (INDEPENDENT_AMBULATORY_CARE_PROVIDER_SITE_OTHER): Payer: Managed Care, Other (non HMO) | Admitting: Family Medicine

## 2016-02-19 VITALS — BP 118/68 | HR 90 | Temp 98.2°F | Resp 16 | Ht 66.0 in | Wt 233.0 lb

## 2016-02-19 DIAGNOSIS — Z720 Tobacco use: Secondary | ICD-10-CM | POA: Diagnosis not present

## 2016-02-19 DIAGNOSIS — I10 Essential (primary) hypertension: Secondary | ICD-10-CM | POA: Diagnosis not present

## 2016-02-19 DIAGNOSIS — F329 Major depressive disorder, single episode, unspecified: Secondary | ICD-10-CM

## 2016-02-19 DIAGNOSIS — M545 Low back pain, unspecified: Secondary | ICD-10-CM

## 2016-02-19 DIAGNOSIS — F419 Anxiety disorder, unspecified: Secondary | ICD-10-CM

## 2016-02-19 DIAGNOSIS — F32A Depression, unspecified: Secondary | ICD-10-CM

## 2016-02-19 DIAGNOSIS — F418 Other specified anxiety disorders: Secondary | ICD-10-CM

## 2016-02-19 MED ORDER — CYCLOBENZAPRINE HCL 10 MG PO TABS: 10.0000 mg | ORAL_TABLET | Freq: Three times a day (TID) | ORAL | Status: DC | PRN

## 2016-02-19 MED ORDER — ESCITALOPRAM OXALATE 20 MG PO TABS: 20.0000 mg | ORAL_TABLET | Freq: Every day | ORAL | Status: DC

## 2016-02-19 MED ORDER — NAPROXEN 500 MG PO TABS
500.0000 mg | ORAL_TABLET | Freq: Two times a day (BID) | ORAL | Status: DC
Start: 2016-02-19 — End: 2022-12-27

## 2016-02-19 NOTE — Assessment & Plan Note (Signed)
Naproxen BID for pain. Continue muscle relaxants. Refer to PT for pain management. Encouraged heat, massage, and gentle stretching for pain at home.

## 2016-02-19 NOTE — Patient Instructions (Signed)
Back pain: Let's try PT for your pain. You can go to [facility] and get new patient paperwork anytime M-Th 7-6pm, Friday 7-5pm. Take naproxen twice daily as needed for back pain. Continue flexeril.  We are increasing your lexapro to 20mg  daily. Take 2 tablets daily until your current bottle is empty and then pick up new prescription for 20mg  pill.  STOP blood pressure medication. Keep up diet and exercise changes. Spot check blood pressure at home.

## 2016-02-19 NOTE — Progress Notes (Signed)
Subjective:    Patient ID: Derrick Doyle, male    DOB: [birth year], 40 y.o.   MRN: [id number]  HPI: Derrick Doyle is a 40 y.o. male presenting on 02/19/2016 for Hypertension   HPI   Here for BP follow up after holding losartan. Also changed antidepressant at last visit.  HTN - BP checks at home 110-130/70-90. No HA, N/V, CP/SOB, vision changes, dizziness, light-headedness, or leg swelling. He has not been taking losartan. 118/68 manual with large cuff.  Depression - Changed to Lexapro last visit. No SI. Feels mood is more stable, but that dose could be higher. He reports irritability at times, but a stable mood. PHQ-9 is 14.  Low back pain - has not heard back from PT yet. Said he called once. Taking flexeril between 1 and 3 times per day. This helps some. He is taking this on work days and is driving, but says he feels safe doing so. Needs a refill on ibuprofen. This helps if he catches the pain early on. 6/10 pain right now, 8/10 with sitting. Pt is standing in the room because it is very uncomfortable for him to sit.   Past Medical History  Diagnosis Date  . Hypertension   . Depression   . Migraines   . GERD (gastroesophageal reflux disease)   . Allergy     Current Outpatient Prescriptions on File Prior to Visit  Medication Sig  . acetaminophen (TYLENOL) 500 MG tablet Take 1 tablet (500 mg total) by mouth every 6 (six) hours as needed.  Marland Kitchen EPIPEN 2-PAK 0.3 MG/0.3ML SOAJ injection use as directed by prescriber  . ibuprofen (ADVIL,MOTRIN) 400 MG tablet Take 1 tablet (400 mg total) by mouth every 6 (six) hours as needed.  . mometasone (NASONEX) 50 MCG/ACT nasal spray 2 sprays by Each Nare route Two (2) times a day.  . ondansetron (ZOFRAN) 4 MG tablet Take 1 tablet (4 mg total) by mouth every 8 (eight) hours as needed for nausea or vomiting.  Marland Kitchen UNKNOWN TO PATIENT    No current facility-administered medications on file prior to visit.    Review of Systems    Constitutional: Negative for fever and chills.  HENT: Negative.  Negative for hearing loss and trouble swallowing.   Eyes: Negative for visual disturbance.  Respiratory: Negative for chest tightness, shortness of breath and wheezing.   Cardiovascular: Negative for chest pain, palpitations and leg swelling.  Gastrointestinal: Negative for nausea, vomiting and abdominal pain.  Endocrine: Negative.   Genitourinary: Negative for dysuria, urgency, discharge, difficulty urinating, penile pain and testicular pain.  Musculoskeletal: Positive for back pain. Negative for joint swelling and arthralgias.  Skin: Negative.  Negative for color change.  Allergic/Immunologic: Positive for environmental allergies.  Neurological: Negative for dizziness, weakness, light-headedness, numbness and headaches.  Psychiatric/Behavioral: Negative for suicidal ideas, sleep disturbance, self-injury and dysphoric mood. The patient is nervous/anxious.    Per HPI unless specifically indicated above     Objective:    BP 118/68 mmHg  Pulse 90  Temp(Src) 98.2 F (36.8 C) (Oral)  Resp 16  Ht 5\' 6"  (1.676 m)  Wt 233 lb (105.688 kg)  BMI 37.63 kg/m2  Wt Readings from Last 3 Encounters:  02/19/16 233 lb (105.688 kg)  01/22/16 230 lb (104.327 kg)  12/25/15 230 lb 3.2 oz (104.418 kg)    Physical Exam  Constitutional: He is oriented to person, place, and time. He appears well-developed and well-nourished. No distress.  HENT:  Head: Normocephalic and atraumatic.  Neck: Normal range of motion. Neck supple. No thyromegaly present.  Cardiovascular: Normal rate, regular rhythm, normal heart sounds and normal pulses.  Exam reveals no gallop and no friction rub.   No murmur heard. Pulses:      Radial pulses are 2+ on the right side, and 2+ on the left side.  Pulmonary/Chest: Effort normal and breath sounds normal. He has no wheezes.  Abdominal: Soft. Bowel sounds are normal. He exhibits no distension. There is no  tenderness. There is no rebound.  Musculoskeletal: He exhibits no edema.       Lumbar back: He exhibits decreased range of motion (unable to fully bend due to pain) and tenderness. He exhibits no pain and no spasm.  Neurological: He is alert and oriented to person, place, and time. He has normal reflexes.  Skin: Skin is warm and dry. No rash noted. No erythema.  Psychiatric: His speech is normal and behavior is normal. Thought content normal. His mood appears anxious.   Results for orders placed or performed in visit on 12/25/15  Culture, Group A Strep  Result Value Ref Range   Strep A Culture Negative   POCT rapid strep A  Result Value Ref Range   Rapid Strep A Screen Negative Negative  POCT Influenza A/B  Result Value Ref Range   Influenza A, POC Negative Negative   Influenza B, POC Negative Negative      Assessment & Plan:   Problem List Items Addressed This Visit      Cardiovascular and Mediastinum   Hypertension - Primary    Hold losartan and continue diet control. Recheck next month.        Other   Low back pain    Naproxen BID for pain. Continue muscle relaxants. Refer to PT for pain management. Encouraged heat, massage, and gentle stretching for pain at home.       Relevant Medications   naproxen (NAPROSYN) 500 MG tablet   cyclobenzaprine (FLEXERIL) 10 MG tablet   Anxiety and depression    Increase lexapro to 20mg  daily. Recheck 1 mos.       Relevant Medications   escitalopram (LEXAPRO) 20 MG tablet   Tobacco use    Encouraged smoking cessation when mood stabilizes.          Meds ordered this encounter  Medications  . naproxen (NAPROSYN) 500 MG tablet    Sig: Take 1 tablet (500 mg total) by mouth 2 (two) times daily with a meal.    Dispense:  30 tablet    Refill:  1    Order Specific Question:  Supervising Provider    Answer:  Arlis Porta [phone]  . cyclobenzaprine (FLEXERIL) 10 MG tablet    Sig: Take 1 tablet (10 mg total) by mouth 3  (three) times daily as needed for muscle spasms.    Dispense:  30 tablet    Refill:  2    Order Specific Question:  Supervising Provider    Answer:  Arlis Porta [phone]  . escitalopram (LEXAPRO) 20 MG tablet    Sig: Take 1 tablet (20 mg total) by mouth daily.    Dispense:  30 tablet    Refill:  11    Order Specific Question:  Supervising Provider    Answer:  Arlis Porta [phone]      Follow up plan: Return in about 4 weeks (around 03/18/2016) for Depression. Marland Kitchen

## 2016-02-19 NOTE — Assessment & Plan Note (Signed)
Encouraged smoking cessation when mood stabilizes.

## 2016-02-19 NOTE — Assessment & Plan Note (Signed)
Increase lexapro to 20mg  daily. Recheck 1 mos.

## 2016-02-19 NOTE — Assessment & Plan Note (Signed)
Hold losartan and continue diet control. Recheck next month.

## 2016-04-06 ENCOUNTER — Ambulatory Visit (INDEPENDENT_AMBULATORY_CARE_PROVIDER_SITE_OTHER): Payer: Managed Care, Other (non HMO) | Admitting: Family Medicine

## 2016-04-06 ENCOUNTER — Encounter: Payer: Self-pay | Admitting: Family Medicine

## 2016-04-06 VITALS — BP 140/84 | HR 84 | Temp 98.0°F | Resp 16 | Ht 66.0 in | Wt 235.0 lb

## 2016-04-06 DIAGNOSIS — F418 Other specified anxiety disorders: Secondary | ICD-10-CM | POA: Diagnosis not present

## 2016-04-06 DIAGNOSIS — F419 Anxiety disorder, unspecified: Principal | ICD-10-CM

## 2016-04-06 DIAGNOSIS — I10 Essential (primary) hypertension: Secondary | ICD-10-CM | POA: Diagnosis not present

## 2016-04-06 DIAGNOSIS — Z720 Tobacco use: Secondary | ICD-10-CM | POA: Diagnosis not present

## 2016-04-06 DIAGNOSIS — F329 Major depressive disorder, single episode, unspecified: Secondary | ICD-10-CM

## 2016-04-06 NOTE — Assessment & Plan Note (Signed)
Pt feels he is doing well on Lexapro. Will continue current dose. Consider adding wellbutrin if symptoms worsen. Awaiting labs with check TSH, Vitamin D. Encouraged daily exercise to help with mental health.

## 2016-04-06 NOTE — Assessment & Plan Note (Signed)
BP is controlled with out medication. Pt will continue to monitor at home. Call if BP > 140/90 consistently. Encouraged daily exercise. DASH diet. Recheck 3 mos.

## 2016-04-06 NOTE — Progress Notes (Signed)
Subjective:    Patient ID: Derrick Doyle, male    DOB: 21-Sep-1976, 40 y.o.   MRN: [id number]  HPI: [name] is a 39 y.o. male presenting on 04/06/2016 for Depression   HPI  Pt presents for depression follow-up. Feels he is improving. He feels better able to cope with anger. Does feel he has mood swings. His job is a significant stressor. Feels more energy with medication. Is motivated to do things. No SI/HI.  HTN: Stopped BP meds due to low BP. Avg at home is 131/80. No HA. No dizziness. No CP.   Past Medical History  Diagnosis Date  . Hypertension   . Depression   . Migraines   . GERD (gastroesophageal reflux disease)   . Allergy     Current Outpatient Prescriptions on File Prior to Visit  Medication Sig  . acetaminophen (TYLENOL) 500 MG tablet Take 1 tablet (500 mg total) by mouth every 6 (six) hours as needed.  . cyclobenzaprine (FLEXERIL) 10 MG tablet Take 1 tablet (10 mg total) by mouth 3 (three) times daily as needed for muscle spasms.  Marland Kitchen EPIPEN 2-PAK 0.3 MG/0.3ML SOAJ injection use as directed by prescriber  . escitalopram (LEXAPRO) 20 MG tablet Take 1 tablet (20 mg total) by mouth daily.  Marland Kitchen ibuprofen (ADVIL,MOTRIN) 400 MG tablet Take 1 tablet (400 mg total) by mouth every 6 (six) hours as needed.  . mometasone (NASONEX) 50 MCG/ACT nasal spray 2 sprays by Each Nare route Two (2) times a day.  . naproxen (NAPROSYN) 500 MG tablet Take 1 tablet (500 mg total) by mouth 2 (two) times daily with a meal.  . ondansetron (ZOFRAN) 4 MG tablet Take 1 tablet (4 mg total) by mouth every 8 (eight) hours as needed for nausea or vomiting.  Marland Kitchen UNKNOWN TO PATIENT    No current facility-administered medications on file prior to visit.    Review of Systems  Constitutional: Negative for fever and chills.  HENT: Negative.   Respiratory: Negative for chest tightness, shortness of breath and wheezing.   Cardiovascular: Negative for chest pain, palpitations and leg swelling.    Gastrointestinal: Negative for nausea, vomiting and abdominal pain.  Endocrine: Negative.   Genitourinary: Negative for dysuria, urgency, discharge, penile pain and testicular pain.  Musculoskeletal: Negative for back pain, joint swelling and arthralgias.  Skin: Negative.   Neurological: Negative for dizziness, weakness, numbness and headaches.  Psychiatric/Behavioral: Positive for dysphoric mood. Negative for suicidal ideas and sleep disturbance.   Per HPI unless specifically indicated above     Objective:    BP 140/84 mmHg  Pulse 84  Temp(Src) 98 F (36.7 C) (Oral)  Resp 16  Ht 5\' 6"  (1.676 m)  Wt 235 lb (106.595 kg)  BMI 37.95 kg/m2  SpO2 100%  Wt Readings from Last 3 Encounters:  04/06/16 235 lb (106.595 kg)  02/19/16 233 lb (105.688 kg)  01/22/16 230 lb (104.327 kg)    Physical Exam  Constitutional: He is oriented to person, place, and time. He appears well-developed and well-nourished. No distress.  HENT:  Head: Normocephalic and atraumatic.  Neck: Neck supple. No thyromegaly present.  Cardiovascular: Normal rate, regular rhythm and normal heart sounds.  Exam reveals no gallop and no friction rub.   No murmur heard. Pulmonary/Chest: Effort normal and breath sounds normal. He has no wheezes.  Abdominal: Soft. Bowel sounds are normal. He exhibits no distension. There is no tenderness. There is no rebound.  Musculoskeletal: Normal range of motion. He  exhibits no edema or tenderness.  Neurological: He is alert and oriented to person, place, and time. He has normal reflexes.  Skin: Skin is warm and dry. No rash noted. No erythema.  Psychiatric: He has a normal mood and affect. His speech is normal and behavior is normal. Judgment and thought content normal. Cognition and memory are normal.   Results for orders placed or performed in visit on 12/25/15  Culture, Group A Strep  Result Value Ref Range   Strep A Culture Negative   POCT rapid strep A  Result Value Ref Range    Rapid Strep A Screen Negative Negative  POCT Influenza A/B  Result Value Ref Range   Influenza A, POC Negative Negative   Influenza B, POC Negative Negative      Assessment & Plan:   Problem List Items Addressed This Visit      Cardiovascular and Mediastinum   Hypertension    BP is controlled with out medication. Pt will continue to monitor at home. Call if BP > 140/90 consistently. Encouraged daily exercise. DASH diet. Recheck 3 mos.         Other   Anxiety and depression - Primary    Pt feels he is doing well on Lexapro. Will continue current dose. Consider adding wellbutrin if symptoms worsen. Awaiting labs with check TSH, Vitamin D. Encouraged daily exercise to help with mental health.       Tobacco use    Encouraged smoking cessation.          No orders of the defined types were placed in this encounter.      Follow up plan: Return in about 3 months (around 07/07/2016) for anxiety. Marland Kitchen

## 2016-04-06 NOTE — Patient Instructions (Signed)
Your goal blood pressure is 140/90.  Work on low salt/sodium diet - goal <1.5gm (1,500mg) per day. Eat a diet high in fruits/vegetables and whole grains.  Look into mediterranean and DASH diet. Goal activity is 150min/wk of moderate intensity exercise.  This can be split into 30 minute chunks.  If you are not at this level, you can start with smaller 10-15 min increments and slowly build up activity. Look at www.heart.org for more resources 

## 2016-04-06 NOTE — Assessment & Plan Note (Signed)
Encouraged smoking cessation 

## 2016-07-20 ENCOUNTER — Ambulatory Visit (INDEPENDENT_AMBULATORY_CARE_PROVIDER_SITE_OTHER): Payer: Managed Care, Other (non HMO) | Admitting: Family Medicine

## 2016-07-20 ENCOUNTER — Encounter: Payer: Self-pay | Admitting: Family Medicine

## 2016-07-20 VITALS — BP 148/87 | HR 83 | Temp 98.8°F | Resp 16 | Ht 66.0 in | Wt 234.6 lb

## 2016-07-20 DIAGNOSIS — R5382 Chronic fatigue, unspecified: Secondary | ICD-10-CM

## 2016-07-20 DIAGNOSIS — F419 Anxiety disorder, unspecified: Principal | ICD-10-CM

## 2016-07-20 DIAGNOSIS — F329 Major depressive disorder, single episode, unspecified: Secondary | ICD-10-CM

## 2016-07-20 DIAGNOSIS — I10 Essential (primary) hypertension: Secondary | ICD-10-CM

## 2016-07-20 DIAGNOSIS — E785 Hyperlipidemia, unspecified: Secondary | ICD-10-CM

## 2016-07-20 DIAGNOSIS — F418 Other specified anxiety disorders: Secondary | ICD-10-CM | POA: Diagnosis not present

## 2016-07-20 LAB — CBC WITH DIFFERENTIAL/PLATELET
BASOS PCT: 0 %
Basophils Absolute: 0 cells/uL (ref 0–200)
EOS PCT: 1 %
Eosinophils Absolute: 92 cells/uL (ref 15–500)
HEMATOCRIT: 40.7 % (ref 38.5–50.0)
Hemoglobin: 13.4 g/dL (ref 13.2–17.1)
LYMPHS PCT: 30 %
Lymphs Abs: 2760 cells/uL (ref 850–3900)
MCH: 26.3 pg — ABNORMAL LOW (ref 27.0–33.0)
MCHC: 32.9 g/dL (ref 32.0–36.0)
MCV: 79.8 fL — ABNORMAL LOW (ref 80.0–100.0)
MONO ABS: 736 {cells}/uL (ref 200–950)
MPV: 10.2 fL (ref 7.5–12.5)
Monocytes Relative: 8 %
Neutro Abs: 5612 cells/uL (ref 1500–7800)
Neutrophils Relative %: 61 %
PLATELETS: 274 10*3/uL (ref 140–400)
RBC: 5.1 MIL/uL (ref 4.20–5.80)
RDW: 14.2 % (ref 11.0–15.0)
WBC: 9.2 10*3/uL (ref 3.8–10.8)

## 2016-07-20 MED ORDER — BUPROPION HCL ER (XL) 150 MG PO TB24: 150.0000 mg | ORAL_TABLET | Freq: Every day | ORAL | 11 refills | Status: DC

## 2016-07-20 MED ORDER — LOSARTAN POTASSIUM 100 MG PO TABS: 100.0000 mg | ORAL_TABLET | Freq: Every day | ORAL | 3 refills | Status: AC

## 2016-07-20 NOTE — Progress Notes (Signed)
Subjective:    Patient ID: Derrick Doyle, male    DOB: January 20, 1976, 40 y.o.   MRN: [id number]  HPI: Derrick Doyle is a 40 y.o. male presenting on 07/20/2016 for Anxiety (still same may be worst)   HPI  Pt presents for anxiety follow-up. He feels the lexapro is not working as well anymore. Was doing well but is not as effective. Nothing has changed in his life.  No new stressor. No SI/ HI. Is trying to change jobs- feeling a little anxious. Feeling depresses and fatigued. His BP is also elevated today. Had stopped taking losartan earlier in the year due to low blood pressures. No chest pain. No shortness of breath. No visual changes.   Past Medical History:  Diagnosis Date  . Allergy   . Depression   . GERD (gastroesophageal reflux disease)   . Hypertension   . Migraines     Current Outpatient Prescriptions on File Prior to Visit  Medication Sig  . acetaminophen (TYLENOL) 500 MG tablet Take 1 tablet (500 mg total) by mouth every 6 (six) hours as needed.  . cyclobenzaprine (FLEXERIL) 10 MG tablet Take 1 tablet (10 mg total) by mouth 3 (three) times daily as needed for muscle spasms.  Marland Kitchen EPIPEN 2-PAK 0.3 MG/0.3ML SOAJ injection use as directed by prescriber  . escitalopram (LEXAPRO) 20 MG tablet Take 1 tablet (20 mg total) by mouth daily.  Marland Kitchen ibuprofen (ADVIL,MOTRIN) 400 MG tablet Take 1 tablet (400 mg total) by mouth every 6 (six) hours as needed.  . mometasone (NASONEX) 50 MCG/ACT nasal spray 2 sprays by Each Nare route Two (2) times a day.  . naproxen (NAPROSYN) 500 MG tablet Take 1 tablet (500 mg total) by mouth 2 (two) times daily with a meal.  . ondansetron (ZOFRAN) 4 MG tablet Take 1 tablet (4 mg total) by mouth every 8 (eight) hours as needed for nausea or vomiting.   No current facility-administered medications on file prior to visit.     Review of Systems  Constitutional: Negative for chills and fever.  HENT: Negative.   Respiratory: Negative for chest tightness,  shortness of breath and wheezing.   Cardiovascular: Negative for chest pain, palpitations and leg swelling.  Gastrointestinal: Negative for abdominal pain, nausea and vomiting.  Endocrine: Negative.   Genitourinary: Negative for discharge, dysuria, penile pain, testicular pain and urgency.  Musculoskeletal: Negative for arthralgias, back pain and joint swelling.  Skin: Negative.   Neurological: Negative for dizziness, weakness, numbness and headaches.  Psychiatric/Behavioral: Positive for decreased concentration and dysphoric mood. Negative for sleep disturbance and suicidal ideas. The patient is nervous/anxious.    Per HPI unless specifically indicated above     Objective:    BP (!) 148/87 (BP Location: Left Arm, Patient Position: Sitting, Cuff Size: Large)   Pulse 83   Temp 98.8 F (37.1 C) (Oral)   Resp 16   Ht 5\' 6"  (1.676 m)   Wt 234 lb 9.6 oz (106.4 kg)   BMI 37.87 kg/m   Wt Readings from Last 3 Encounters:  07/20/16 234 lb 9.6 oz (106.4 kg)  04/06/16 235 lb (106.6 kg)  02/19/16 233 lb (105.7 kg)    GAD 7 : Generalized Anxiety Score 07/20/2016  Nervous, Anxious, on Edge 3  Control/stop worrying 0  Worry too much - different things 3  Trouble relaxing 3  Restless 3  Easily annoyed or irritable 3  Afraid - awful might happen 1  Total GAD 7 Score 16  Anxiety Difficulty Somewhat difficult     Physical Exam  Constitutional: He is oriented to person, place, and time. He appears well-developed and well-nourished. No distress.  HENT:  Head: Normocephalic and atraumatic.  Neck: Neck supple. No thyromegaly present.  Cardiovascular: Normal rate, regular rhythm and normal heart sounds.  Exam reveals no gallop and no friction rub.   No murmur heard. Pulmonary/Chest: Effort normal and breath sounds normal. He has no wheezes.  Abdominal: Soft. Bowel sounds are normal. He exhibits no distension. There is no tenderness. There is no rebound.  Musculoskeletal: Normal range of  motion. He exhibits no edema or tenderness.  Neurological: He is alert and oriented to person, place, and time. He has normal reflexes.  Skin: Skin is warm and dry. No rash noted. No erythema.  Psychiatric: His behavior is normal. Thought content normal. He exhibits a depressed mood. He expresses no suicidal ideation. He expresses no suicidal plans.   Results for orders placed or performed in visit on 12/25/15  Culture, Group A Strep  Result Value Ref Range   Strep A Culture Negative   POCT rapid strep A  Result Value Ref Range   Rapid Strep A Screen Negative Negative  POCT Influenza A/B  Result Value Ref Range   Influenza A, POC Negative Negative   Influenza B, POC Negative Negative      Assessment & Plan:   Problem List Items Addressed This Visit      Cardiovascular and Mediastinum   Hypertension    Restart Cozaar at this time. Check CMP. Recheck 4 weeks.       Relevant Medications   losartan (COZAAR) 100 MG tablet   Other Relevant Orders   COMPLETE METABOLIC PANEL WITH GFR     Other   Anxiety and depression - Primary    Exacerbated today. Will check TSH, B12, and vitamin D today. Add Wellbutrin to help with depressive symptoms. Discussed therapy as adjunct if medication is not helping. Encouraged daily exercise- recheck 4 weeks.       Relevant Medications   buPROPion (WELLBUTRIN XL) 150 MG 24 hr tablet   Other Relevant Orders   TSH   Vitamin D (25 hydroxy)   B12    Other Visit Diagnoses    Mild hyperlipidemia       Check lipid panel.    Relevant Medications   losartan (COZAAR) 100 MG tablet   Other Relevant Orders   Lipid Profile   Chronic fatigue       Likely depression but will check TSH, Vitamin B12, Vitamin D, and CBC.    Relevant Orders   CBC with Differential      Meds ordered this encounter  Medications  . DISCONTD: losartan (COZAAR) 100 MG tablet  . buPROPion (WELLBUTRIN XL) 150 MG 24 hr tablet    Sig: Take 1 tablet (150 mg total) by mouth  daily.    Dispense:  30 tablet    Refill:  11    Order Specific Question:   Supervising Provider    Answer:   Arlis Porta [phone]  . losartan (COZAAR) 100 MG tablet    Sig: Take 1 tablet (100 mg total) by mouth daily.    Dispense:  90 tablet    Refill:  3    Order Specific Question:   Supervising Provider    Answer:   Arlis Porta [id number]      Follow up plan: Return in about 4 weeks (around 08/17/2016) for Depression. Marland Kitchen

## 2016-07-20 NOTE — Assessment & Plan Note (Addendum)
Restart Cozaar at this time. Check CMP. Recheck 4 weeks.

## 2016-07-20 NOTE — Assessment & Plan Note (Signed)
Exacerbated today. Will check TSH, B12, and vitamin D today. Add Wellbutrin to help with depressive symptoms. Discussed therapy as adjunct if medication is not helping. Encouraged daily exercise- recheck 4 weeks.

## 2016-07-20 NOTE — Patient Instructions (Addendum)
Let's add Wellbutrin to your Lexapro. Take once daily. This should help with your symptoms. Also aim for 30 minutes of exercise per day to help with your symptoms.  We will check some lab work to determine if you anything is causing your symptoms.   Start taking your Cozaar again to help control your blood pressure.

## 2016-07-21 LAB — TSH: TSH: 1.07 mIU/L (ref 0.40–4.50)

## 2016-07-21 LAB — COMPLETE METABOLIC PANEL WITH GFR
ALK PHOS: 95 U/L (ref 40–115)
ALT: 21 U/L (ref 9–46)
AST: 20 U/L (ref 10–40)
Albumin: 3.8 g/dL (ref 3.6–5.1)
BILIRUBIN TOTAL: 0.3 mg/dL (ref 0.2–1.2)
BUN: 11 mg/dL (ref 7–25)
CALCIUM: 8.9 mg/dL (ref 8.6–10.3)
CO2: 27 mmol/L (ref 20–31)
Chloride: 102 mmol/L (ref 98–110)
Creat: 0.77 mg/dL (ref 0.60–1.35)
Glucose, Bld: 84 mg/dL (ref 65–99)
Potassium: 4.1 mmol/L (ref 3.5–5.3)
Sodium: 140 mmol/L (ref 135–146)
TOTAL PROTEIN: 6.2 g/dL (ref 6.1–8.1)

## 2016-07-21 LAB — LIPID PANEL
CHOLESTEROL: 201 mg/dL — AB (ref 125–200)
HDL: 46 mg/dL (ref >=40)
LDL CALC: 129 mg/dL (ref <130)
TRIGLYCERIDES: 130 mg/dL (ref <150)
Total CHOL/HDL Ratio: 4.4 Ratio (ref <=5.0)
VLDL: 26 mg/dL (ref <30)

## 2016-07-21 LAB — VITAMIN D 25 HYDROXY (VIT D DEFICIENCY, FRACTURES): Vit D, 25-Hydroxy: 30 ng/mL (ref 30–100)

## 2016-07-21 LAB — VITAMIN B12: VITAMIN B 12: 190 pg/mL — AB (ref 200–1100)

## 2016-07-29 ENCOUNTER — Ambulatory Visit (INDEPENDENT_AMBULATORY_CARE_PROVIDER_SITE_OTHER): Payer: Managed Care, Other (non HMO)

## 2016-07-29 VITALS — BP 134/77 | HR 75 | Temp 98.9°F | Resp 16 | Ht 66.0 in | Wt 235.0 lb

## 2016-07-29 DIAGNOSIS — E538 Deficiency of other specified B group vitamins: Secondary | ICD-10-CM

## 2016-07-29 MED ORDER — CYANOCOBALAMIN 1000 MCG/ML IJ SOLN
1000.0000 ug | Freq: Once | INTRAMUSCULAR | Status: AC
  Administered 2016-07-29: 1000 ug via INTRAMUSCULAR

## 2016-08-04 ENCOUNTER — Encounter: Payer: Self-pay | Admitting: Family Medicine

## 2016-08-05 ENCOUNTER — Ambulatory Visit: Payer: Managed Care, Other (non HMO)

## 2016-08-05 ENCOUNTER — Other Ambulatory Visit: Payer: Self-pay | Admitting: Family Medicine

## 2016-08-05 DIAGNOSIS — R509 Fever, unspecified: Secondary | ICD-10-CM

## 2016-08-05 DIAGNOSIS — M545 Low back pain, unspecified: Secondary | ICD-10-CM

## 2016-08-06 MED ORDER — ACETAMINOPHEN 500 MG PO TABS: 500.0000 mg | ORAL_TABLET | Freq: Four times a day (QID) | ORAL | 0 refills | Status: DC | PRN

## 2016-08-06 MED ORDER — IBUPROFEN 400 MG PO TABS: 400.0000 mg | ORAL_TABLET | Freq: Four times a day (QID) | ORAL | 0 refills | Status: DC | PRN

## 2016-08-16 ENCOUNTER — Ambulatory Visit: Payer: Managed Care, Other (non HMO) | Admitting: Family Medicine

## 2016-08-17 ENCOUNTER — Ambulatory Visit: Payer: Managed Care, Other (non HMO) | Admitting: Family Medicine

## 2018-03-01 ENCOUNTER — Ambulatory Visit: Admission: RE | Admit: 2018-03-01 | Payer: Medicaid Other | Source: Ambulatory Visit | Admitting: Internal Medicine

## 2018-03-27 ENCOUNTER — Encounter: Payer: Self-pay | Admitting: *Deleted

## 2018-03-28 ENCOUNTER — Encounter: Payer: Self-pay | Admitting: Anesthesiology

## 2018-03-28 ENCOUNTER — Encounter
Admission: RE | Disposition: A | Discharge status: Home / Self Care | Payer: Self-pay | Source: Ambulatory Visit | Attending: Internal Medicine

## 2018-03-28 ENCOUNTER — Encounter: Admission: RE | Payer: Self-pay | Source: Ambulatory Visit

## 2018-03-28 ENCOUNTER — Ambulatory Visit
Admission: RE | Admit: 2018-03-28 | Discharge: 2018-03-28 | Disposition: A | Discharge status: Home / Self Care | Payer: Medicaid Other | Source: Ambulatory Visit | Attending: Internal Medicine | Admitting: Internal Medicine

## 2018-03-28 ENCOUNTER — Ambulatory Visit: Payer: Medicaid Other | Admitting: Anesthesiology

## 2018-03-28 DIAGNOSIS — K921 Melena: Secondary | ICD-10-CM | POA: Insufficient documentation

## 2018-03-28 DIAGNOSIS — D123 Benign neoplasm of transverse colon: Secondary | ICD-10-CM | POA: Insufficient documentation

## 2018-03-28 DIAGNOSIS — J449 Chronic obstructive pulmonary disease, unspecified: Secondary | ICD-10-CM | POA: Insufficient documentation

## 2018-03-28 DIAGNOSIS — Z79899 Other long term (current) drug therapy: Secondary | ICD-10-CM | POA: Diagnosis not present

## 2018-03-28 DIAGNOSIS — E8801 Alpha-1-antitrypsin deficiency: Secondary | ICD-10-CM | POA: Insufficient documentation

## 2018-03-28 DIAGNOSIS — Z980 Intestinal bypass and anastomosis status: Secondary | ICD-10-CM | POA: Diagnosis not present

## 2018-03-28 DIAGNOSIS — K219 Gastro-esophageal reflux disease without esophagitis: Secondary | ICD-10-CM | POA: Diagnosis not present

## 2018-03-28 DIAGNOSIS — F419 Anxiety disorder, unspecified: Secondary | ICD-10-CM | POA: Insufficient documentation

## 2018-03-28 DIAGNOSIS — F329 Major depressive disorder, single episode, unspecified: Secondary | ICD-10-CM | POA: Insufficient documentation

## 2018-03-28 DIAGNOSIS — I340 Nonrheumatic mitral (valve) insufficiency: Secondary | ICD-10-CM | POA: Diagnosis not present

## 2018-03-28 DIAGNOSIS — K2950 Unspecified chronic gastritis without bleeding: Secondary | ICD-10-CM | POA: Insufficient documentation

## 2018-03-28 DIAGNOSIS — I10 Essential (primary) hypertension: Secondary | ICD-10-CM | POA: Insufficient documentation

## 2018-03-28 DIAGNOSIS — Z87891 Personal history of nicotine dependence: Secondary | ICD-10-CM | POA: Insufficient documentation

## 2018-03-28 DIAGNOSIS — K640 First degree hemorrhoids: Secondary | ICD-10-CM | POA: Diagnosis not present

## 2018-03-28 HISTORY — DX: Alpha-1-antitrypsin deficiency: E88.01

## 2018-03-28 HISTORY — DX: Cardiomegaly: I51.7

## 2018-03-28 HISTORY — PX: ESOPHAGOGASTRODUODENOSCOPY (EGD) WITH PROPOFOL: SHX5813

## 2018-03-28 HISTORY — DX: Nonrheumatic mitral (valve) insufficiency: I34.0

## 2018-03-28 HISTORY — DX: Chronic obstructive pulmonary disease, unspecified: J44.9

## 2018-03-28 HISTORY — PX: COLONOSCOPY WITH PROPOFOL: SHX5780

## 2018-03-28 HISTORY — DX: Dyspnea, unspecified: R06.00

## 2018-03-28 LAB — GLUCOSE, CAPILLARY: Glucose-Capillary: 110 mg/dL — ABNORMAL HIGH (ref 65–99)

## 2018-03-28 SURGERY — COLONOSCOPY WITH PROPOFOL
Anesthesia: General

## 2018-03-28 MED ORDER — PROPOFOL 500 MG/50ML IV EMUL
INTRAVENOUS | Status: AC
Start: 2018-03-28 — End: 2018-03-28
  Filled 2018-03-28: qty 50

## 2018-03-28 MED ORDER — SODIUM CHLORIDE 0.9 % IV SOLN
INTRAVENOUS | Status: DC
  Administered 2018-03-28: 1000 mL via INTRAVENOUS

## 2018-03-28 MED ORDER — MIDAZOLAM HCL 2 MG/2ML IJ SOLN
INTRAMUSCULAR | Status: AC
  Filled 2018-03-28: qty 2

## 2018-03-28 MED ORDER — PROPOFOL 500 MG/50ML IV EMUL
INTRAVENOUS | Status: DC | PRN
  Administered 2018-03-28: 120 ug/kg/min via INTRAVENOUS

## 2018-03-28 MED ORDER — FENTANYL CITRATE (PF) 100 MCG/2ML IJ SOLN
INTRAMUSCULAR | Status: AC
  Filled 2018-03-28: qty 2

## 2018-03-28 MED ORDER — PROPOFOL 10 MG/ML IV BOLUS
INTRAVENOUS | Status: DC | PRN
  Administered 2018-03-28: 90 mg via INTRAVENOUS

## 2018-03-28 MED ORDER — MIDAZOLAM HCL 2 MG/2ML IJ SOLN
INTRAMUSCULAR | Status: DC | PRN
  Administered 2018-03-28: 2 mg via INTRAVENOUS

## 2018-03-28 MED ORDER — FENTANYL CITRATE (PF) 100 MCG/2ML IJ SOLN
INTRAMUSCULAR | Status: DC | PRN
  Administered 2018-03-28: 50 ug via INTRAVENOUS

## 2018-03-28 NOTE — Op Note (Signed)
[facility] Gastroenterology Patient Name: Derrick Doyle Procedure Date: 03/28/2018 10:53 AM MRN: [id number] Account #: [id number] Date of Birth: [birth year] Admit Type: Outpatient Age: 42 Room: [facility] ENDO ROOM 3 Gender: Male Note Status: Finalized Procedure:            Upper GI endoscopy Indications:          Suspected esophageal reflux, Nausea with vomiting Providers:            Benay Pike. Alice Reichert MD, MD Referring MD:         No Local Md, MD (Referring MD) Medicines:            Propofol per Anesthesia Complications:        No immediate complications. Procedure:            Pre-Anesthesia Assessment:                       - The risks and benefits of the procedure and the                        sedation options and risks were discussed with the                        patient. All questions were answered and informed                        consent was obtained.                       - Patient identification and proposed procedure were                        verified prior to the procedure by the nurse. The                        procedure was verified in the procedure room.                       - ASA Grade Assessment: II - A patient with mild                        systemic disease.                       - After reviewing the risks and benefits, the patient                        was deemed in satisfactory condition to undergo the                        procedure.                       After obtaining informed consent, the endoscope was                        passed under direct vision. Throughout the procedure,                        the patient's blood pressure, pulse, and oxygen  saturations were monitored continuously. The Endoscope                        was introduced through the mouth, and advanced to the                        efferent jejunal loop. The upper GI endoscopy was                        accomplished without difficulty. The  patient tolerated                        the procedure well. Findings:      The examined esophagus was normal.      Evidence of a Roux-en-Y gastrojejunostomy was found. The gastrojejunal       anastomosis was characterized by healthy appearing mucosa. This was       traversed. The pouch-to-jejunum limb was characterized by healthy       appearing mucosa. The jejunojejunal anastomosis was characterized by       healthy appearing mucosa. The duodenum-to-jejunum limb was not examined       as it could not be traversed. Biopsies were taken with a cold forceps       for Helicobacter pylori testing.      The examined jejunum was normal. Impression:           - Normal esophagus.                       - Roux-en-Y gastrojejunostomy with gastrojejunal                        anastomosis characterized by healthy appearing mucosa.                        Biopsied.                       - Normal examined jejunum. Recommendation:       - Await pathology results.                       - Proceed with colonoscopy Procedure Code(s):    --- Professional ---                       [phone], Esophagogastroduodenoscopy, flexible, transoral;                        with biopsy, single or multiple Diagnosis Code(s):    --- Professional ---                       R11.2, Nausea with vomiting, unspecified                       Z98.0, Intestinal bypass and anastomosis status CPT copyright 2017 American Medical Association. All rights reserved. The codes documented in this report are preliminary and upon coder review may  be revised to meet current compliance requirements. Efrain Sella MD, MD 03/28/2018 11:12:39 AM This report has been signed electronically. Number of Addenda: 0 Note Initiated On: 03/28/2018 10:53 AM      [facility]

## 2018-03-28 NOTE — Transfer of Care (Signed)
Immediate Anesthesia Transfer of Care Note  Patient: Derrick Doyle  Procedure(s) Performed: COLONOSCOPY WITH PROPOFOL (N/A ) ESOPHAGOGASTRODUODENOSCOPY (EGD) WITH PROPOFOL (N/A )  Patient Location: PACU and Endoscopy Unit  Anesthesia Type:General  Level of Consciousness: drowsy and patient cooperative  Airway & Oxygen Therapy: Patient Spontanous Breathing and Patient connected to nasal cannula oxygen  Post-op Assessment: Report given to RN and Post -op Vital signs reviewed and stable  Post vital signs: Reviewed and stable  Last Vitals:  Vitals Value Taken Time  BP 112/62 03/28/2018 11:36 AM  Temp 35.6 C 03/28/2018 11:30 AM  Pulse 65 03/28/2018 11:38 AM  Resp 13 03/28/2018 11:38 AM  SpO2 100 % 03/28/2018 11:38 AM  Vitals shown include unvalidated device data.  Last Pain:  Vitals:   03/28/18 1130  TempSrc: Tympanic  PainSc:          Complications: No apparent anesthesia complications

## 2018-03-28 NOTE — Anesthesia Post-op Follow-up Note (Signed)
Anesthesia QCDR form completed.        

## 2018-03-28 NOTE — Anesthesia Postprocedure Evaluation (Signed)
Anesthesia Post Note  Patient: HONORIO DEVOL  Procedure(s) Performed: COLONOSCOPY WITH PROPOFOL (N/A ) ESOPHAGOGASTRODUODENOSCOPY (EGD) WITH PROPOFOL (N/A )  Patient location during evaluation: Endoscopy Anesthesia Type: General Level of consciousness: awake and alert Pain management: pain level controlled Vital Signs Assessment: post-procedure vital signs reviewed and stable Respiratory status: spontaneous breathing, nonlabored ventilation, respiratory function stable and patient connected to nasal cannula oxygen Cardiovascular status: blood pressure returned to baseline and stable Postop Assessment: no apparent nausea or vomiting Anesthetic complications: no     Last Vitals:  Vitals:   03/28/18 1140 03/28/18 1150  BP: 120/71 (!) 140/95  Pulse: 63   Resp: 13   Temp:    SpO2: 100%     Last Pain:  Vitals:   03/28/18 1150  TempSrc:   PainSc: 0-No pain                 Martha Clan

## 2018-03-28 NOTE — Op Note (Addendum)
[facility] Gastroenterology Patient Name: Derrick Doyle Procedure Date: 03/28/2018 10:52 AM MRN: [id number] Account #: [id number] Date of Birth: 04-27-1976 Admit Type: Outpatient Age: 43 Room: [facility] ENDO ROOM 3 Gender: Male Note Status: Finalized Procedure:            Colonoscopy Indications:          Hematochezia Providers:            Benay Pike. Alice Reichert MD, MD Referring MD:         No Local Md, MD (Referring MD) Medicines:            Propofol per Anesthesia Complications:        No immediate complications. Procedure:            Pre-Anesthesia Assessment:                       - The risks and benefits of the procedure and the                        sedation options and risks were discussed with the                        patient. All questions were answered and informed                        consent was obtained.                       - Patient identification and proposed procedure were                        verified prior to the procedure by the nurse. The                        procedure was verified in the procedure room.                       - ASA Grade Assessment: II - A patient with mild                        systemic disease.                       - After reviewing the risks and benefits, the patient                        was deemed in satisfactory condition to undergo the                        procedure.                       After obtaining informed consent, the colonoscope was                        passed under direct vision. Throughout the procedure,                        the patient's blood pressure, pulse, and oxygen  saturations were monitored continuously. The                        Colonoscope was introduced through the anus and                        advanced to the the terminal ileum, with identification                        of the appendiceal orifice and IC valve. The                        colonoscopy was  performed without difficulty. The                        patient tolerated the procedure well. The quality of                        the bowel preparation was good. The terminal ileum,                        ileocecal valve, appendiceal orifice, and rectum were                        photographed. Findings:      The perianal and digital rectal examinations were normal. Pertinent       negatives include normal sphincter tone and no palpable rectal lesions.      A 5 mm polyp was found in the hepatic flexure. The polyp was sessile.       The polyp was removed with a cold snare. Resection and retrieval were       complete.      A 4 mm polyp was found in the transverse colon. The polyp was sessile.       The polyp was removed with a jumbo cold forceps. Resection and retrieval       were complete.      Non-bleeding internal hemorrhoids were found during retroflexion. The       hemorrhoids were Grade I (internal hemorrhoids that do not prolapse).      The exam was otherwise without abnormality. Impression:           - One 5 mm polyp at the hepatic flexure, removed with a                        cold snare. Resected and retrieved.                       - One 4 mm polyp in the transverse colon, removed with                        a jumbo cold forceps. Resected and retrieved.                       - Non-bleeding internal hemorrhoids.                       - The examination was otherwise normal. Recommendation:       - Patient has a contact number available for  emergencies. The signs and symptoms of potential                        delayed complications were discussed with the patient.                        Return to normal activities tomorrow. Written discharge                        instructions were provided to the patient.                       - Resume previous diet.                       - Continue present medications.                       - Repeat colonoscopy is  recommended for surveillance.                        The colonoscopy date will be determined after pathology                        results from today's exam become available for review.                       - Await pathology results from EGD, also performed                        today.                       - Use Prilosec (omeprazole) 40 mg PO daily.                       - Return to physician assistant in 3 months.                       - The findings and recommendations were discussed with                        the patient and their family. Procedure Code(s):    --- Professional ---                       [phone], Colonoscopy, flexible; with removal of tumor(s),                        polyp(s), or other lesion(s) by snare technique                       [id number], 75, Colonoscopy, flexible; with biopsy, single                        or multiple Diagnosis Code(s):    --- Professional ---                       K92.1, Melena (includes Hematochezia)                       K64.0, First degree hemorrhoids  D12.3, Benign neoplasm of transverse colon (hepatic                        flexure or splenic flexure) CPT copyright 2017 American Medical Association. All rights reserved. The codes documented in this report are preliminary and upon coder review may  be revised to meet current compliance requirements. Efrain Sella MD, MD 03/28/2018 11:34:56 AM This report has been signed electronically. Number of Addenda: 0 Note Initiated On: 03/28/2018 10:52 AM Scope Withdrawal Time: 0 hours 8 minutes 26 seconds  Total Procedure Duration: 0 hours 14 minutes 20 seconds       [facility]

## 2018-03-28 NOTE — Interval H&P Note (Signed)
History and Physical Interval Note:  03/28/2018 10:30 AM  Derrick Doyle  has presented today for surgery, with the diagnosis of HEMATOCHEZIA  The various methods of treatment have been discussed with the patient and family. After consideration of risks, benefits and other options for treatment, the patient has consented to  Procedure(s): COLONOSCOPY WITH PROPOFOL (N/A) ESOPHAGOGASTRODUODENOSCOPY (EGD) WITH PROPOFOL (N/A) as a surgical intervention .  The patient's history has been reviewed, patient examined, no change in status, stable for surgery.  I have reviewed the patient's chart and labs.  Questions were answered to the patient's satisfaction.     [place], [place]

## 2018-03-28 NOTE — H&P (Signed)
Outpatient short stay form Pre-procedure 03/28/2018 10:29 AM Derrick Doyle K. Alice Reichert, M.D.  Primary Physician: N/A  Reason for visit:  Nausea, hematochezia, GERD.   History of present illness: Patient is a 42 year old male presenting for the above complaints.  Patient has intermittent hematochezia presumably related to hemorrhoids.  He has persistent nausea and GERD symptoms for which he was not treated previously.  Our office to start the patient on omeprazole 20 mg daily with some modest improvement.    Current Facility-Administered Medications:  .  0.9 %  sodium chloride infusion, , Intravenous, Continuous, Evans, Benay Pike, MD, Last Rate: 20 mL/hr at 03/28/18 1019, 1,000 mL at 03/28/18 1019  Medications Prior to Admission  Medication Sig Dispense Refill Last Dose  . acetaminophen (TYLENOL) 500 MG tablet Take 1 tablet (500 mg total) by mouth every 6 (six) hours as needed. 30 tablet 0 Past Week at Unknown time  . buPROPion (WELLBUTRIN XL) 150 MG 24 hr tablet Take 1 tablet (150 mg total) by mouth daily. 30 tablet 11 Past Week at Unknown time  . cetirizine (ZYRTEC) 10 MG tablet Take 10 mg by mouth daily.   03/27/2018 at Unknown time  . cyclobenzaprine (FLEXERIL) 10 MG tablet Take 1 tablet (10 mg total) by mouth 3 (three) times daily as needed for muscle spasms. 30 tablet 2 Past Week at Unknown time  . EPIPEN 2-PAK 0.3 MG/0.3ML SOAJ injection use as directed by prescriber  0 Past Week at Unknown time  . escitalopram (LEXAPRO) 20 MG tablet Take 1 tablet (20 mg total) by mouth daily. 30 tablet 11 Past Week at Unknown time  . ibuprofen (ADVIL,MOTRIN) 400 MG tablet Take 1 tablet (400 mg total) by mouth every 6 (six) hours as needed. 30 tablet 0 Past Week at Unknown time  . losartan (COZAAR) 100 MG tablet Take 1 tablet (100 mg total) by mouth daily. 90 tablet 3 Past Week at Unknown time  . mometasone (NASONEX) 50 MCG/ACT nasal spray 2 sprays by Each Nare route Two (2) times a day.   Past Week at Unknown  time  . naproxen (NAPROSYN) 500 MG tablet Take 1 tablet (500 mg total) by mouth 2 (two) times daily with a meal. 30 tablet 1 Past Week at Unknown time  . omeprazole (PRILOSEC) 20 MG capsule Take 20 mg by mouth daily.   Past Week at Unknown time  . ondansetron (ZOFRAN) 4 MG tablet Take 1 tablet (4 mg total) by mouth every 8 (eight) hours as needed for nausea or vomiting. 20 tablet 0 Past Week at Unknown time     No Known Allergies   Past Medical History:  Diagnosis Date  . Allergy   . Alpha-1-antitrypsin deficiency ([place])   . Cardiomegaly   . COPD (chronic obstructive pulmonary disease) ([place])   . Depression   . Dyspnea   . GERD (gastroesophageal reflux disease)   . Hypertension   . Migraines   . Nonrheumatic mitral (valve) insufficiency     Review of systems:   Otherwise negative.   Physical Exam  Gen: Alert, oriented. Appears stated age.  HEENT: [place]/AT. PERRLA. Lungs: CTA, no wheezes. CV: RR nl S1, S2. Abd: soft, benign, no masses. BS+ Ext: No edema. Pulses 2+    Planned procedures: EGD and colonoscopy. The patient understands the nature of the planned procedure, indications, risks, alternatives and potential complications including but not limited to bleeding, infection, perforation, damage to internal organs and possible oversedation/side effects from anesthesia. The patient agrees and gives consent  to proceed.  Please refer to procedure notes for findings, recommendations and patient disposition/instructions.    [name] K. Alice Reichert, M.D. Gastroenterology 03/28/2018  10:29 AM

## 2018-03-28 NOTE — Anesthesia Preprocedure Evaluation (Signed)
Anesthesia Evaluation  Patient identified by MRN, date of birth, ID band Patient awake    Reviewed: Allergy & Precautions, H&P , NPO status , Patient's Chart, lab work & pertinent test results, reviewed documented beta blocker date and time   History of Anesthesia Complications Negative for: history of anesthetic complications  Airway Mallampati: I  TM Distance: >3 FB Neck ROM: full    Dental  (+) Dental Advidsory Given, Teeth Intact   Pulmonary shortness of breath, asthma (allergen induced) , neg sleep apnea, COPD,  COPD inhaler, neg recent URI, former smoker,           Cardiovascular Exercise Tolerance: Good hypertension, (-) angina(-) CAD, (-) Past MI, (-) Cardiac Stents and (-) CABG (-) dysrhythmias (-) Valvular Problems/Murmurs     Neuro/Psych  Headaches, neg Seizures PSYCHIATRIC DISORDERS Anxiety Depression    GI/Hepatic Neg liver ROS, GERD  ,  Endo/Other  negative endocrine ROS  Renal/GU Only one kidney  negative genitourinary   Musculoskeletal   Abdominal   Peds  Hematology negative hematology ROS (+)   Anesthesia Other Findings Past Medical History: No date: Allergy No date: Alpha-1-antitrypsin deficiency ([place]) No date: Cardiomegaly No date: COPD (chronic obstructive pulmonary disease) (HCC) No date: Depression No date: Dyspnea No date: GERD (gastroesophageal reflux disease) No date: Hypertension No date: Migraines No date: Nonrheumatic mitral (valve) insufficiency   Reproductive/Obstetrics negative OB ROS                             Anesthesia Physical Anesthesia Plan  ASA: III  Anesthesia Plan: General   Post-op Pain Management:    Induction: Intravenous  PONV Risk Score and Plan: 2 and Propofol infusion  Airway Management Planned: Nasal Cannula  Additional Equipment:   Intra-op Plan:   Post-operative Plan:   Informed Consent: I have reviewed the patients  History and Physical, chart, labs and discussed the procedure including the risks, benefits and alternatives for the proposed anesthesia with the patient or authorized representative who has indicated his/her understanding and acceptance.   Dental Advisory Given  Plan Discussed with: Anesthesiologist, CRNA and Surgeon  Anesthesia Plan Comments:         Anesthesia Quick Evaluation

## 2018-03-29 ENCOUNTER — Encounter: Payer: Self-pay | Admitting: Internal Medicine

## 2018-03-29 LAB — SURGICAL PATHOLOGY

## 2018-07-22 ENCOUNTER — Emergency Department: Payer: Medicaid Other

## 2018-07-22 ENCOUNTER — Other Ambulatory Visit: Payer: Self-pay

## 2018-07-22 ENCOUNTER — Encounter: Payer: Self-pay | Admitting: Emergency Medicine

## 2018-07-22 DIAGNOSIS — R079 Chest pain, unspecified: Secondary | ICD-10-CM | POA: Diagnosis not present

## 2018-07-22 DIAGNOSIS — Z5321 Procedure and treatment not carried out due to patient leaving prior to being seen by health care provider: Secondary | ICD-10-CM | POA: Insufficient documentation

## 2018-07-22 LAB — COMPREHENSIVE METABOLIC PANEL
ALBUMIN: 4.4 g/dL (ref 3.5–5.0)
ALT: 26 U/L (ref 0–44)
AST: 38 U/L (ref 15–41)
Alkaline Phosphatase: 109 U/L (ref 38–126)
Anion gap: 11 (ref 5–15)
BUN: 11 mg/dL (ref 6–20)
CHLORIDE: 104 mmol/L (ref 98–111)
CO2: 21 mmol/L — AB (ref 22–32)
CREATININE: 0.8 mg/dL (ref 0.61–1.24)
Calcium: 9.1 mg/dL (ref 8.9–10.3)
GFR calc non Af Amer: >60 mL/min (ref >60)
GLUCOSE: 105 mg/dL — AB (ref 70–99)
Potassium: 4.4 mmol/L (ref 3.5–5.1)
SODIUM: 136 mmol/L (ref 135–145)
Total Bilirubin: 1.3 mg/dL — ABNORMAL HIGH (ref 0.3–1.2)
Total Protein: 7.1 g/dL (ref 6.5–8.1)

## 2018-07-22 LAB — CBC
HEMATOCRIT: 37.2 % — AB (ref 40.0–52.0)
HEMOGLOBIN: 12.2 g/dL — AB (ref 13.0–18.0)
MCH: 23.7 pg — ABNORMAL LOW (ref 26.0–34.0)
MCHC: 32.7 g/dL (ref 32.0–36.0)
MCV: 72.5 fL — ABNORMAL LOW (ref 80.0–100.0)
Platelets: 247 10*3/uL (ref 150–440)
RBC: 5.13 MIL/uL (ref 4.40–5.90)
RDW: 15.5 % — ABNORMAL HIGH (ref 11.5–14.5)
WBC: 8.6 10*3/uL (ref 3.8–10.6)

## 2018-07-22 LAB — TROPONIN I: Troponin I: <0.03 ng/mL (ref <0.03)

## 2018-07-22 NOTE — ED Triage Notes (Signed)
Chest pain since 5 pm today. States was resting when it started. States feels like its hard to take a deep breath. Skin pink warm and dry. States feels very anxious.

## 2018-07-23 ENCOUNTER — Emergency Department
Admission: EM | Admit: 2018-07-23 | Discharge: 2018-07-23 | Discharge status: Against Medical Advice | Payer: Medicaid Other | Attending: Emergency Medicine | Admitting: Emergency Medicine

## 2018-07-23 NOTE — ED Notes (Signed)
Resting quietly with eyes closed, no acute distress noted. °

## 2018-07-26 ENCOUNTER — Telehealth: Payer: Self-pay | Admitting: Emergency Medicine

## 2018-07-26 NOTE — Telephone Encounter (Signed)
Called patient due to lwot to inquire about condition and follow up plans. Left message.   

## 2019-01-04 ENCOUNTER — Ambulatory Visit
Admission: RE | Admit: 2019-01-04 | Discharge: 2019-01-04 | Disposition: A | Discharge status: Home / Self Care | Payer: Medicaid Other | Source: Ambulatory Visit | Attending: Gastroenterology | Admitting: Gastroenterology

## 2019-01-04 ENCOUNTER — Other Ambulatory Visit: Payer: Self-pay | Admitting: Gastroenterology

## 2019-01-04 DIAGNOSIS — R112 Nausea with vomiting, unspecified: Secondary | ICD-10-CM

## 2019-01-04 DIAGNOSIS — R1319 Other dysphagia: Secondary | ICD-10-CM

## 2019-01-04 DIAGNOSIS — R1310 Dysphagia, unspecified: Secondary | ICD-10-CM

## 2019-01-04 MED ORDER — IOPAMIDOL (ISOVUE-300) INJECTION 61%
100.0000 mL | Freq: Once | INTRAVENOUS | Status: AC | PRN
  Administered 2019-01-04: 85 mL via INTRAVENOUS

## 2019-01-10 ENCOUNTER — Emergency Department
Admission: EM | Admit: 2019-01-10 | Discharge: 2019-01-10 | Disposition: A | Discharge status: Home / Self Care | Payer: Medicaid Other | Attending: Emergency Medicine | Admitting: Emergency Medicine

## 2019-01-10 ENCOUNTER — Encounter: Payer: Self-pay | Admitting: Emergency Medicine

## 2019-01-10 ENCOUNTER — Other Ambulatory Visit: Payer: Self-pay

## 2019-01-10 ENCOUNTER — Emergency Department: Payer: Medicaid Other

## 2019-01-10 DIAGNOSIS — F419 Anxiety disorder, unspecified: Secondary | ICD-10-CM | POA: Diagnosis not present

## 2019-01-10 DIAGNOSIS — Z79899 Other long term (current) drug therapy: Secondary | ICD-10-CM | POA: Diagnosis not present

## 2019-01-10 DIAGNOSIS — R079 Chest pain, unspecified: Secondary | ICD-10-CM | POA: Diagnosis present

## 2019-01-10 DIAGNOSIS — F17220 Nicotine dependence, chewing tobacco, uncomplicated: Secondary | ICD-10-CM | POA: Diagnosis not present

## 2019-01-10 DIAGNOSIS — I10 Essential (primary) hypertension: Secondary | ICD-10-CM | POA: Diagnosis not present

## 2019-01-10 DIAGNOSIS — J449 Chronic obstructive pulmonary disease, unspecified: Secondary | ICD-10-CM | POA: Diagnosis not present

## 2019-01-10 LAB — BASIC METABOLIC PANEL
Anion gap: 11 (ref 5–15)
BUN: 15 mg/dL (ref 6–20)
CO2: 21 mmol/L — ABNORMAL LOW (ref 22–32)
Calcium: 8.9 mg/dL (ref 8.9–10.3)
Chloride: 104 mmol/L (ref 98–111)
Creatinine, Ser: 1.08 mg/dL (ref 0.61–1.24)
GFR calc Af Amer: >60 mL/min (ref >60)
Glucose, Bld: 242 mg/dL — ABNORMAL HIGH (ref 70–99)
Potassium: 3.6 mmol/L (ref 3.5–5.1)
Sodium: 136 mmol/L (ref 135–145)

## 2019-01-10 LAB — CBC
HCT: 40 % (ref 39.0–52.0)
Hemoglobin: 12.1 g/dL — ABNORMAL LOW (ref 13.0–17.0)
MCH: 21.5 pg — ABNORMAL LOW (ref 26.0–34.0)
MCHC: 30.3 g/dL (ref 30.0–36.0)
MCV: 71 fL — ABNORMAL LOW (ref 80.0–100.0)
Platelets: 349 10*3/uL (ref 150–400)
RBC: 5.63 MIL/uL (ref 4.22–5.81)
RDW: 16.2 % — ABNORMAL HIGH (ref 11.5–15.5)
WBC: 9.7 10*3/uL (ref 4.0–10.5)
nRBC: 0 % (ref 0.0–0.2)

## 2019-01-10 LAB — TROPONIN I: Troponin I: <0.03 ng/mL (ref <0.03)

## 2019-01-10 MED ORDER — ALPRAZOLAM 0.5 MG PO TABS: 0.5000 mg | ORAL_TABLET | Freq: Three times a day (TID) | ORAL | 0 refills | Status: AC | PRN

## 2019-01-10 MED ORDER — HYDROXYZINE HCL 25 MG PO TABS
50.0000 mg | ORAL_TABLET | Freq: Once | ORAL | Status: AC
  Administered 2019-01-10: 50 mg via ORAL
  Filled 2019-01-10: qty 2

## 2019-01-10 MED ORDER — ALPRAZOLAM 0.5 MG PO TABS
0.5000 mg | ORAL_TABLET | Freq: Once | ORAL | Status: AC
  Administered 2019-01-10: 0.5 mg via ORAL
  Filled 2019-01-10 (×2): qty 1

## 2019-01-10 NOTE — Discharge Instructions (Addendum)
You may increase your hydroxyzine to 50 mg 4 times a day as needed.  If your anxiety is too severe you can take 1 Xanax up to 3 times a day.  Follow-up at [facility] for further help.

## 2019-01-10 NOTE — ED Triage Notes (Signed)
PT arrives with concerns over chest pain that pt reports started Sunday. Pt very anxious in triage. Pt states his chest pain feels like pressure and has been intermittent lasting 15 minutes each time. Pt's breathing unlabored and regular.

## 2019-01-10 NOTE — ED Provider Notes (Signed)
[facility] Emergency Department Provider Note  ____________________________________________  Time seen: Approximately 9:05 PM  I have reviewed the triage vital signs and the nursing notes.   HISTORY  Chief Complaint Chest Pain   HPI Derrick Doyle is a 43 y.o. male with a history of COPD, hypertension, anxiety who presents for evaluation of chest pain.  Patient reports that he has been having hourly panic attacks for the last 5 days.  He reports that he is on hydroxyzine 25 mg 3 times daily but that is not helping.  He denies any inciting events.  He reports that his anxiety attacks make him have chest pain, shortness of breath, hyperventilation and nausea.  He has been on Ativan in the past which was more successful however according to the patient his primary care doctor has lost his privileges to prescribe controlled substances.  He has referred patient to a psychiatrist however he has not been able to get an appointment until several weeks in the future.  He denies any history of smoking, coronary artery disease, PE or DVT, recent travel immobilization, leg pain or swelling, hemoptysis, or exogenous hormones.  Patient denies cough, congestion, fever, chills, abdominal pain, vomiting, diarrhea.   Past Medical History:  Diagnosis Date  . Allergy   . Alpha-1-antitrypsin deficiency ([place])   . Cardiomegaly   . COPD (chronic obstructive pulmonary disease) ([place])   . Depression   . Dyspnea   . GERD (gastroesophageal reflux disease)   . Hypertension   . Migraines   . Nonrheumatic mitral (valve) insufficiency     Patient Active Problem List   Diagnosis Date Noted  . Allergic rhinitis 01/22/2016  . Low back pain 01/22/2016  . Anxiety and depression 01/22/2016  . Migraine without aura 01/22/2016  . Tobacco use 01/22/2016  . Hypertension 12/25/2015  . S/P gastric bypass 12/25/2015  . Finger joint stiff 07/18/2015  . Carpal tunnel syndrome 07/02/2015  .  Hand numbness 07/02/2015    Past Surgical History:  Procedure Laterality Date  . CHOLECYSTECTOMY  2012  . COLONOSCOPY WITH PROPOFOL N/A 03/28/2018   Procedure: COLONOSCOPY WITH PROPOFOL;  Surgeon: Toledo, Benay Pike, MD;  Location: [facility] ENDOSCOPY;  Service: Gastroenterology;  Laterality: N/A;  . ESOPHAGOGASTRODUODENOSCOPY (EGD) WITH PROPOFOL N/A 03/28/2018   Procedure: ESOPHAGOGASTRODUODENOSCOPY (EGD) WITH PROPOFOL;  Surgeon: Toledo, Benay Pike, MD;  Location: [facility] ENDOSCOPY;  Service: Gastroenterology;  Laterality: N/A;  . ROUX-EN-Y PROCEDURE  2012  . STRABISMUS SURGERY  2013    Prior to Admission medications   Medication Sig Start Date End Date Taking? Authorizing Provider  acetaminophen (TYLENOL) 500 MG tablet Take 1 tablet (500 mg total) by mouth every 6 (six) hours as needed. 08/06/16   Krebs, Genevie Cheshire, NP  ALPRAZolam Duanne Moron) 0.5 MG tablet Take 1 tablet (0.5 mg total) by mouth 3 (three) times daily as needed for sleep or anxiety. 01/10/19 01/10/20  Rudene Re, MD  buPROPion (WELLBUTRIN XL) 150 MG 24 hr tablet Take 1 tablet (150 mg total) by mouth daily. 07/20/16   Luciana Axe, NP  cetirizine (ZYRTEC) 10 MG tablet Take 10 mg by mouth daily.    [provider]  cyclobenzaprine (FLEXERIL) 10 MG tablet Take 1 tablet (10 mg total) by mouth 3 (three) times daily as needed for muscle spasms. 02/19/16   Luciana Axe, NP  EPIPEN 2-PAK 0.3 MG/0.3ML SOAJ injection use as directed by prescriber 09/26/15   [provider]  escitalopram (LEXAPRO) 20 MG tablet Take 1 tablet (  20 mg total) by mouth daily. 02/19/16   Krebs, Genevie Cheshire, NP  ibuprofen (ADVIL,MOTRIN) 400 MG tablet Take 1 tablet (400 mg total) by mouth every 6 (six) hours as needed. 08/06/16   Krebs, Genevie Cheshire, NP  losartan (COZAAR) 100 MG tablet Take 1 tablet (100 mg total) by mouth daily. 07/20/16   Krebs, Amy Lauren, NP  mometasone (NASONEX) 50 MCG/ACT nasal spray 2 sprays by Each Nare route Two (2) times a day.     [provider]  naproxen (NAPROSYN) 500 MG tablet Take 1 tablet (500 mg total) by mouth 2 (two) times daily with a meal. 02/19/16   Krebs, Amy Lauren, NP  omeprazole (PRILOSEC) 20 MG capsule Take 20 mg by mouth daily.    [provider]  ondansetron (ZOFRAN) 4 MG tablet Take 1 tablet (4 mg total) by mouth every 8 (eight) hours as needed for nausea or vomiting. 12/25/15   Luciana Axe, NP    Allergies Patient has no known allergies.  Family History  Adopted: Yes    Social History Social History   Tobacco Use  . Smoking status: Current Some Day Smoker  . Smokeless tobacco: Current User    Types: Chew  Substance Use Topics  . Alcohol use: Yes    Alcohol/week: 0.0 standard drinks    Comment: ocassional  . Drug use: No    Review of Systems  Constitutional: Negative for fever. Eyes: Negative for visual changes. ENT: Negative for sore throat. Neck: No neck pain  Cardiovascular: + chest pain. Respiratory: + shortness of breath. Gastrointestinal: Negative for abdominal pain, vomiting or diarrhea. Genitourinary: Negative for dysuria. Musculoskeletal: Negative for back pain. Skin: Negative for rash. Neurological: Negative for headaches, weakness or numbness. Psych: No SI or HI. + anxiety  ____________________________________________   PHYSICAL EXAM:  VITAL SIGNS: ED Triage Vitals  Enc Vitals Group     BP 01/10/19 1526 (!) 158/84     Pulse Rate 01/10/19 1526 (!) 119     Resp 01/10/19 1526 18     Temp 01/10/19 1526 97.9 F (36.6 C)     Temp Source 01/10/19 1526 Oral     SpO2 01/10/19 1526 100 %     Weight 01/10/19 1527 187 lb (84.8 kg)     Height 01/10/19 1527 5\' 6"  (1.676 m)     Head Circumference --      Peak Flow --      Pain Score 01/10/19 1526 7     Pain Loc --      Pain Edu? --      Excl. in [place]? --     Constitutional: Alert and oriented, extremely anxious appearing.  HEENT:      Head: Normocephalic and atraumatic.         Eyes:  Conjunctivae are normal. Sclera is non-icteric.       Mouth/Throat: Mucous membranes are moist.       Neck: Supple with no signs of meningismus. Cardiovascular: Tachycardic with regular rhythm. No murmurs, gallops, or rubs. 2+ symmetrical distal pulses are present in all extremities. No JVD. Respiratory: Normal respiratory effort. Lungs are clear to auscultation bilaterally. No wheezes, crackles, or rhonchi.  Gastrointestinal: Soft, non tender, and non distended with positive bowel sounds. No rebound or guarding. Musculoskeletal: Nontender with normal range of motion in all extremities. No edema, cyanosis, or erythema of extremities. Neurologic: Normal speech and language. Face is symmetric. Moving all extremities. No gross focal neurologic deficits are appreciated. Skin: Skin is  warm, dry and intact. No rash noted. Psychiatric: Mood and affect are normal. Speech and behavior are normal.  ____________________________________________   LABS (all labs ordered are listed, but only abnormal results are displayed)  Labs Reviewed  BASIC METABOLIC PANEL - Abnormal; Notable for the following components:      Result Value   CO2 21 (*)    Glucose, Bld 242 (*)    All other components within normal limits  CBC - Abnormal; Notable for the following components:   Hemoglobin 12.1 (*)    MCV 71.0 (*)    MCH 21.5 (*)    RDW 16.2 (*)    All other components within normal limits  TROPONIN I  TROPONIN I   ____________________________________________  EKG  ED ECG REPORT I, Rudene Re, the attending physician, personally viewed and interpreted this ECG.  Sinus tachycardia, rate of 119, normal intervals, normal axis, T wave inversion lead aVL, no STE or depression.  Normal sinus rhythm has been replaced by sinus tachycardia when compared to prior from 07/2018. ____________________________________________  RADIOLOGY  I have personally reviewed the images performed during this visit and I  agree with the Radiologist's read.   Interpretation by Radiologist:  Dg Chest 2 View  Result Date: 01/10/2019 CLINICAL DATA:  43 y/o  M; chest pain. EXAM: CHEST - 2 VIEW COMPARISON:  07/22/2018 chest radiograph. FINDINGS: Stable heart size and mediastinal contours are within normal limits. Both lungs are clear. Stable deformity of left fifth rib. No acute osseous abnormality identified. Right upper quadrant cholecystectomy clips. IMPRESSION: No acute pulmonary process identified. Electronically Signed   By: Kristine Garbe M.D.   On: 01/10/2019 15:55     ____________________________________________   PROCEDURES  Procedure(s) performed: None Procedures Critical Care performed:  None ____________________________________________   INITIAL IMPRESSION / ASSESSMENT AND PLAN / ED COURSE  43 y.o. male with a history of COPD, hypertension, anxiety who presents for evaluation of anxiety, CP, SOB.  Patient is extremely anxious on exam, tachycardic but otherwise vital signs are within normal limits, exam showing regular rate and rhythm, with clear lungs, no evidence of pitting edema.  EKG showing sinus tachycardia with no acute ischemic changes.  Chest x-ray is negative for pneumonia, pulmonary edema, pneumothorax.  First troponin is negative.  Presentation is consistent with a panic attack.  Patient is currently on hydroxyzine 25mg  3 times daily will increase to 50mg  4 times daily. Will give a dose here and also xanax 0.5mg .  Patient is concerned that his primary care doctor is unable to prescribe him benzodiazepines.  I told him that I am comfortable giving him 24 hours of it but other than that he would have to see a psychiatrist or change primary care doctors to be able to get chronic prescriptions for benzodiazepines.  Patient is trying to get an appointment with a psychiatrist.  I will refer him to [facility] for further help.  Low suspicion for ACS based on history, constant chest pain for several  days, no ischemic EKG and a negative troponin.  However we will get a second troponin to further stratify patient.  He is low risk per heart score.    _________________________ 10:09 PM on 01/10/2019 -----------------------------------------  Second troponin is negative.  Patient looks much more improved after a dose of hydroxyzine and Xanax.  I was able to prescribe him a small amount of Xanax and recommended that he takes that only in severe cases otherwise I increase his dose of hydroxyzine to 50 mg 4  times daily.  I am referring patient to [facility].  Discussed and return precautions for chest pain.   As part of my medical decision making, I reviewed the following data within the [place] notes reviewed and incorporated, Labs reviewed , EKG interpreted , Old EKG reviewed, Old chart reviewed, Radiograph reviewed , Notes from prior ED visits and [place] Controlled Substance Database    Pertinent labs & imaging results that were available during my care of the patient were reviewed by me and considered in my medical decision making (see chart for details).    ____________________________________________   FINAL CLINICAL IMPRESSION(S) / ED DIAGNOSES  Final diagnoses:  Anxiety      NEW MEDICATIONS STARTED DURING THIS VISIT:  ED Discharge Orders         Ordered    ALPRAZolam (XANAX) 0.5 MG tablet  3 times daily PRN     01/10/19 2205           Note:  This document was prepared using Dragon voice recognition software and may include unintentional dictation errors.    Alfred Levins, [place], MD 01/10/19 2209

## 2019-01-12 ENCOUNTER — Ambulatory Visit
Admission: RE | Admit: 2019-01-12 | Discharge: 2019-01-12 | Disposition: A | Discharge status: Home / Self Care | Payer: Medicaid Other | Source: Ambulatory Visit | Attending: Gastroenterology | Admitting: Gastroenterology

## 2019-01-12 DIAGNOSIS — R1319 Other dysphagia: Secondary | ICD-10-CM

## 2019-01-12 DIAGNOSIS — R1310 Dysphagia, unspecified: Secondary | ICD-10-CM | POA: Insufficient documentation

## 2019-01-15 ENCOUNTER — Encounter: Payer: Self-pay | Admitting: Intensive Care

## 2019-01-15 ENCOUNTER — Other Ambulatory Visit: Payer: Self-pay

## 2019-01-15 ENCOUNTER — Emergency Department
Admission: EM | Admit: 2019-01-15 | Discharge: 2019-01-15 | Disposition: A | Discharge status: Home / Self Care | Payer: Medicaid Other | Attending: Emergency Medicine | Admitting: Emergency Medicine

## 2019-01-15 DIAGNOSIS — F17220 Nicotine dependence, chewing tobacco, uncomplicated: Secondary | ICD-10-CM | POA: Diagnosis not present

## 2019-01-15 DIAGNOSIS — F419 Anxiety disorder, unspecified: Secondary | ICD-10-CM | POA: Diagnosis not present

## 2019-01-15 DIAGNOSIS — I10 Essential (primary) hypertension: Secondary | ICD-10-CM | POA: Insufficient documentation

## 2019-01-15 DIAGNOSIS — Z79899 Other long term (current) drug therapy: Secondary | ICD-10-CM | POA: Diagnosis not present

## 2019-01-15 DIAGNOSIS — J449 Chronic obstructive pulmonary disease, unspecified: Secondary | ICD-10-CM | POA: Insufficient documentation

## 2019-01-15 DIAGNOSIS — Z760 Encounter for issue of repeat prescription: Secondary | ICD-10-CM

## 2019-01-15 HISTORY — DX: Anxiety disorder, unspecified: F41.9

## 2019-01-15 MED ORDER — GABAPENTIN 300 MG PO CAPS: 300.0000 mg | ORAL_CAPSULE | Freq: Three times a day (TID) | ORAL | 0 refills | Status: AC

## 2019-01-15 NOTE — ED Provider Notes (Signed)
[facility] Emergency Department Provider Note  ____________________________________________   First MD Initiated Contact with Patient 01/15/19 1713     (approximate)  I have reviewed the triage vital signs and the nursing notes.   HISTORY  Chief Complaint Medication Refill    HPI Derrick Doyle is a 43 y.o. male presents emergency department requesting a med refill for Xanax.  He states he has an appointment on April 20 to see a psychiatrist and is afraid he will run out of his medicine.    Past Medical History:  Diagnosis Date  . Allergy   . Alpha-1-antitrypsin deficiency ([place])   . Anxiety   . Cardiomegaly   . COPD (chronic obstructive pulmonary disease) ([place])   . Depression   . Dyspnea   . GERD (gastroesophageal reflux disease)   . Hypertension   . Migraines   . Nonrheumatic mitral (valve) insufficiency     Patient Active Problem List   Diagnosis Date Noted  . Allergic rhinitis 01/22/2016  . Low back pain 01/22/2016  . Anxiety and depression 01/22/2016  . Migraine without aura 01/22/2016  . Tobacco use 01/22/2016  . Hypertension 12/25/2015  . S/P gastric bypass 12/25/2015  . Finger joint stiff 07/18/2015  . Carpal tunnel syndrome 07/02/2015  . Hand numbness 07/02/2015    Past Surgical History:  Procedure Laterality Date  . CHOLECYSTECTOMY  2012  . COLONOSCOPY WITH PROPOFOL N/A 03/28/2018   Procedure: COLONOSCOPY WITH PROPOFOL;  Surgeon: Toledo, Benay Pike, MD;  Location: [facility] ENDOSCOPY;  Service: Gastroenterology;  Laterality: N/A;  . ESOPHAGOGASTRODUODENOSCOPY (EGD) WITH PROPOFOL N/A 03/28/2018   Procedure: ESOPHAGOGASTRODUODENOSCOPY (EGD) WITH PROPOFOL;  Surgeon: Toledo, Benay Pike, MD;  Location: [facility] ENDOSCOPY;  Service: Gastroenterology;  Laterality: N/A;  . ROUX-EN-Y PROCEDURE  2012  . STRABISMUS SURGERY  2013    Prior to Admission medications   Medication Sig Start Date End Date Taking? Authorizing Provider    acetaminophen (TYLENOL) 500 MG tablet Take 1 tablet (500 mg total) by mouth every 6 (six) hours as needed. 08/06/16   Krebs, Genevie Cheshire, NP  ALPRAZolam Duanne Moron) 0.5 MG tablet Take 1 tablet (0.5 mg total) by mouth 3 (three) times daily as needed for sleep or anxiety. 01/10/19 01/10/20  Rudene Re, MD  buPROPion (WELLBUTRIN XL) 150 MG 24 hr tablet Take 1 tablet (150 mg total) by mouth daily. 07/20/16   Luciana Axe, NP  cetirizine (ZYRTEC) 10 MG tablet Take 10 mg by mouth daily.    [provider]  cyclobenzaprine (FLEXERIL) 10 MG tablet Take 1 tablet (10 mg total) by mouth 3 (three) times daily as needed for muscle spasms. 02/19/16   Luciana Axe, NP  EPIPEN 2-PAK 0.3 MG/0.3ML SOAJ injection use as directed by prescriber 09/26/15   [provider]  escitalopram (LEXAPRO) 20 MG tablet Take 1 tablet (20 mg total) by mouth daily. 02/19/16   Luciana Axe, NP  gabapentin (NEURONTIN) 300 MG capsule Take 1 capsule (300 mg total) by mouth 3 (three) times daily. prn 01/15/19   Caryn Section Linden Dolin, PA-C  ibuprofen (ADVIL,MOTRIN) 400 MG tablet Take 1 tablet (400 mg total) by mouth every 6 (six) hours as needed. 08/06/16   Krebs, Genevie Cheshire, NP  losartan (COZAAR) 100 MG tablet Take 1 tablet (100 mg total) by mouth daily. 07/20/16   Krebs, Amy Lauren, NP  mometasone (NASONEX) 50 MCG/ACT nasal spray 2 sprays by Each Nare route Two (2) times a day.    [provider]  naproxen (NAPROSYN) 500 MG tablet Take 1 tablet (500 mg total) by mouth 2 (two) times daily with a meal. 02/19/16   Krebs, Amy Lauren, NP  omeprazole (PRILOSEC) 20 MG capsule Take 20 mg by mouth daily.    [provider]  ondansetron (ZOFRAN) 4 MG tablet Take 1 tablet (4 mg total) by mouth every 8 (eight) hours as needed for nausea or vomiting. 12/25/15   Luciana Axe, NP    Allergies Patient has no known allergies.  Family History  Adopted: Yes    Social History Social History   Tobacco Use  .  Smoking status: Former Smoker    Last attempt to quit: 2018    Years since quitting: 2.1  . Smokeless tobacco: Current User    Types: Chew  Substance Use Topics  . Alcohol use: Yes    Alcohol/week: 0.0 standard drinks    Comment: ocassional  . Drug use: No    Review of Systems  Constitutional: No fever/chills, here for med refill Eyes: No visual changes. ENT: No sore throat. Respiratory: Denies cough Genitourinary: Negative for dysuria. Musculoskeletal: Negative for back pain. Skin: Negative for rash.    ____________________________________________   PHYSICAL EXAM:  VITAL SIGNS: ED Triage Vitals [01/15/19 1700]  Enc Vitals Group     BP (!) 155/103     Pulse Rate 74     Resp 16     Temp 98.3 F (36.8 C)     Temp Source Oral     SpO2 100 %     Weight 187 lb (84.8 kg)     Height 5\' 6"  (1.676 m)     Head Circumference      Peak Flow      Pain Score 0     Pain Loc      Pain Edu?      Excl. in [place]?     Constitutional: Alert and oriented. Well appearing and in no acute distress. Eyes: Conjunctivae are normal.  Head: Atraumatic. Nose: No congestion/rhinnorhea. Mouth/Throat: Mucous membranes are moist.   Neck:  supple no lymphadenopathy noted Cardiovascular: Normal rate, regular rhythm. Heart sounds are normal Respiratory: Normal respiratory effort.  No retractions, lungs c t a  GU: deferred Musculoskeletal: FROM all extremities, warm and well perfused Neurologic:  Normal speech and language.  Skin:  Skin is warm, dry and intact. No rash noted. Psychiatric: Mood and affect are normal. Speech and behavior are normal.  ____________________________________________   LABS (all labs ordered are listed, but only abnormal results are displayed)  Labs Reviewed - No data to  display ____________________________________________   ____________________________________________  RADIOLOGY    ____________________________________________   PROCEDURES  Procedure(s) performed: No  Procedures    ____________________________________________   INITIAL IMPRESSION / ASSESSMENT AND PLAN / ED COURSE  Pertinent labs & imaging results that were available during my care of the patient were reviewed by me and considered in my medical decision making (see chart for details).   Patient is 43 year old male presents emergency department requesting a med refill of Xanax.  Physical exam shows a well adult.  He does have the bottle in his hand which still has 4 pills.  Explained to him we do not refill controlled substances.  However many psychiatrist are now using gabapentin for acute anxiety attacks.  I would be willing to prescribe this.  He was agreeable.  He was discharged in stable condition.     As part of my medical decision making, I reviewed the  following data within the [place] notes reviewed and incorporated, Old chart reviewed, Notes from prior ED visits and [place] Controlled Substance Database  ____________________________________________   FINAL CLINICAL IMPRESSION(S) / ED DIAGNOSES  Final diagnoses:  Medication refill      NEW MEDICATIONS STARTED DURING THIS VISIT:  New Prescriptions   GABAPENTIN (NEURONTIN) 300 MG CAPSULE    Take 1 capsule (300 mg total) by mouth 3 (three) times daily. prn     Note:  This document was prepared using Dragon voice recognition software and may include unintentional dictation errors.    Versie Starks, PA-C 01/15/19 1753    Schuyler Amor, MD 01/15/19 [phone]

## 2019-01-15 NOTE — Discharge Instructions (Addendum)
Follow-up with your regular doctor or [facility] as needed.  Use the medication as needed.  Return if worsening.

## 2019-01-15 NOTE — ED Notes (Signed)
Hard copy signed. 

## 2019-01-15 NOTE — ED Notes (Signed)
See triage note   States he is not able to see [facility] until April  States he will be out of his meds

## 2019-01-15 NOTE — ED Triage Notes (Signed)
Was seen here Wednesday for anxiety attack and discharged to go to Derrick Doyle. Patient went to [facility] today for initial consult and cannot see MD until April 20th. PAtient reports he was prescribed 10 xanax pills and only has 4 left. He's concerned he might not have enough to last until his appointment. Patient is here for another prescription to last until his appointment with Dr. Randel Doyle at [facility]. Denies any symptoms or pain

## 2020-04-05 IMAGING — RF DG ESOPHAGUS
7 of 8 series · 14 of 24 positions shown · non-contrast
Comparison: Body CT 01/04/2019

CLINICAL DATA: The patient has a feeling of solids and liquids
getting stuck in his throat.

EXAM:
ESOPHOGRAM / BARIUM SWALLOW / BARIUM TABLET STUDY
TECHNIQUE: Combined double contrast and single contrast examination performed
using effervescent crystals, thick barium liquid, and thin barium
liquid. The patient was observed with fluoroscopy swallowing a 13 mm
barium sulphate tablet.
FLUOROSCOPY TIME:  Fluoroscopy Time:  3 minutes 6 seconds
Radiation Exposure Index (if provided by the fluoroscopic device):
22 mGy
Number of Acquired Spot Images: 0

[Series 1: cp_standard · 0.31mm/px · 2 of 621 frames shown (1 of 7)]
[frame 8/621]
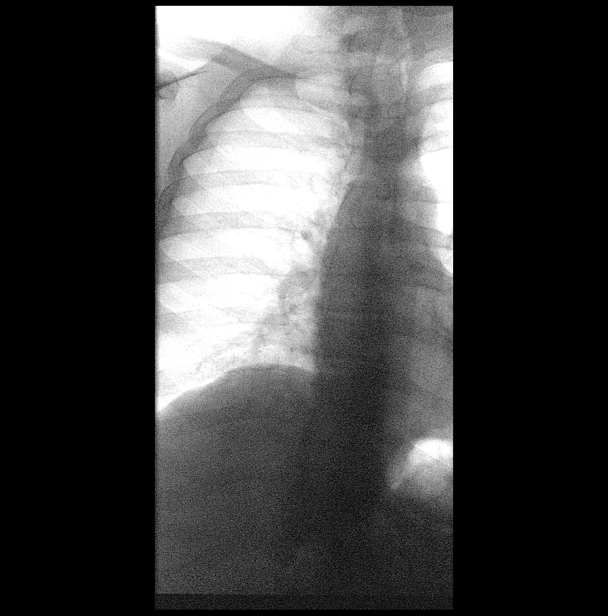
[frame 311/621]
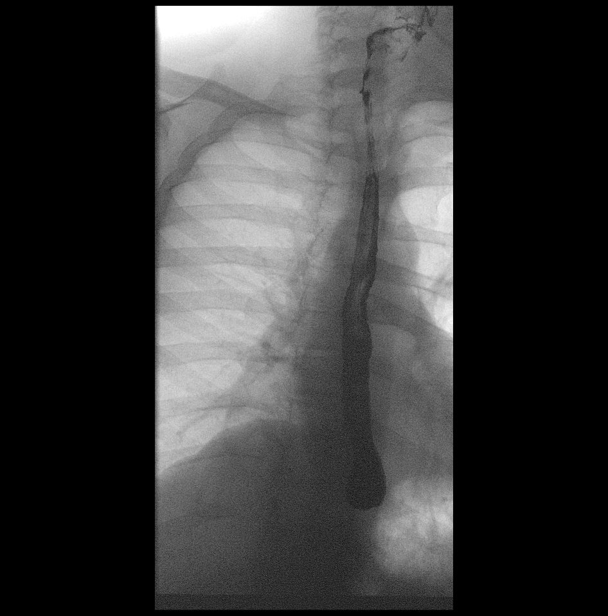

[Series 3: cp_standard · 0.32mm/px · 2 of 9 frames shown (2 of 7)]
[frame 2/9]
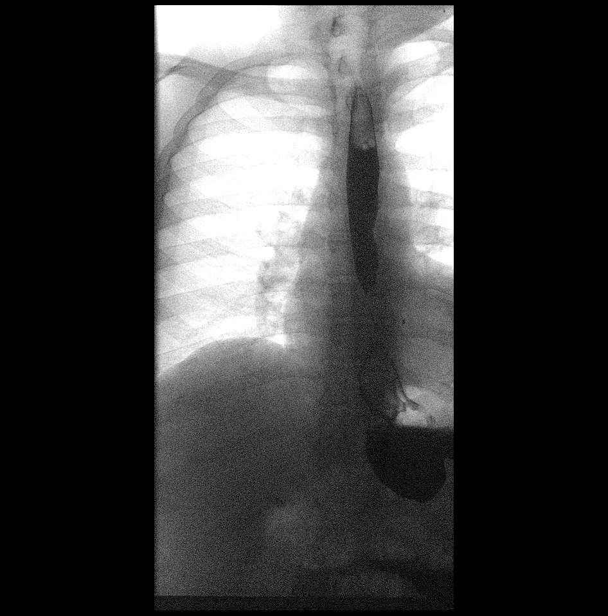
[frame 5/9]
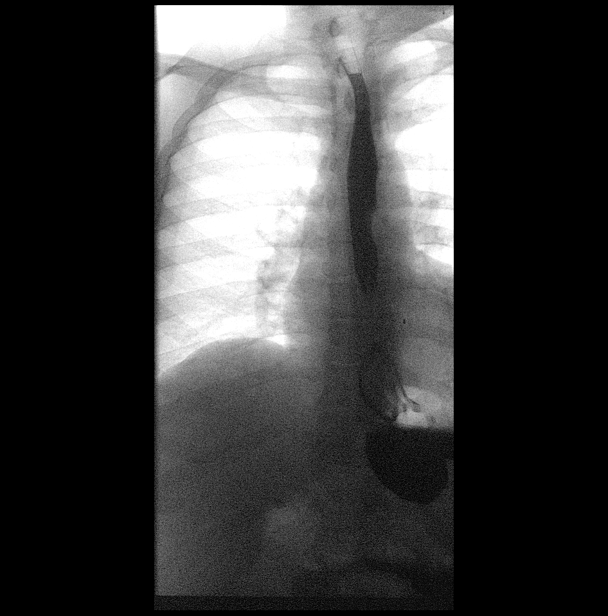

[Series 4: cp_standard · 0.32mm/px · 2 of 392 frames shown (3 of 7)]
[frame 59/392]
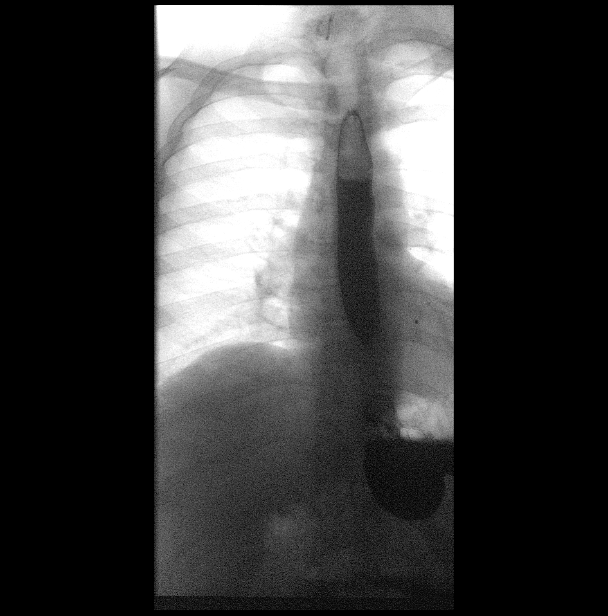
[frame 294/392]
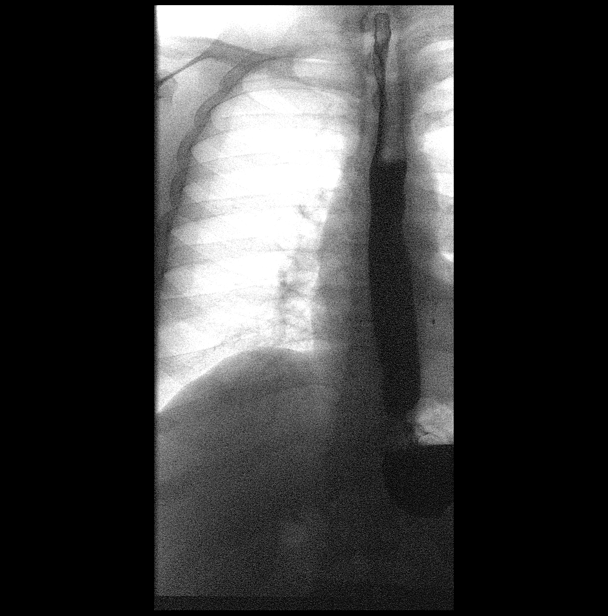

[Series 5: cp_standard · 0.31mm/px · 2 of 484 frames shown (4 of 7)]
[frame 73/484]
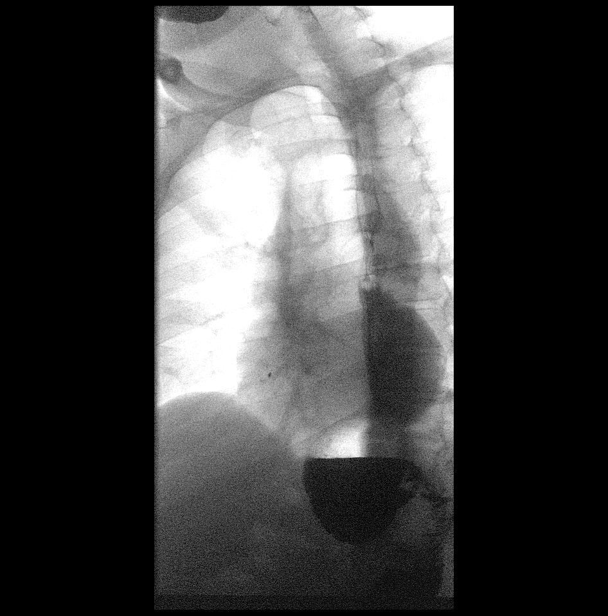
[frame 243/484]
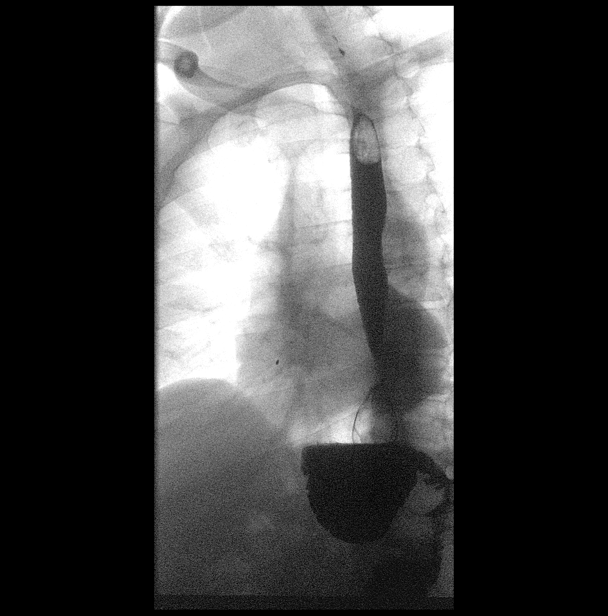

[Series 6: cp_standard · 0.32mm/px · 2 of 130 frames shown (5 of 7)]
[frame 20/130]
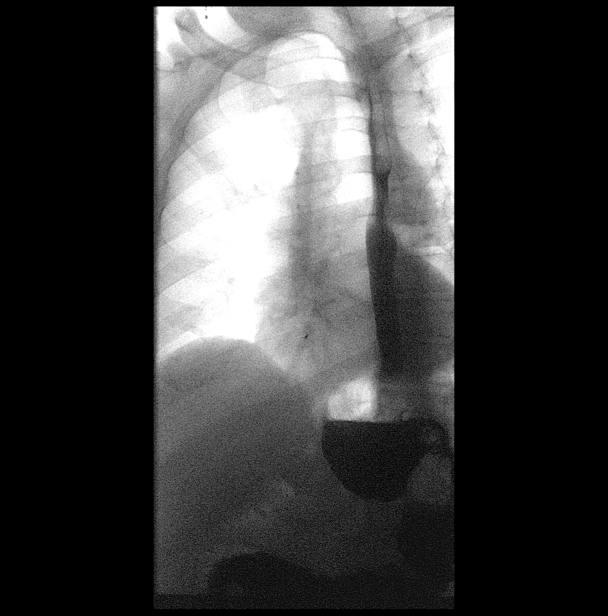
[frame 103/130]
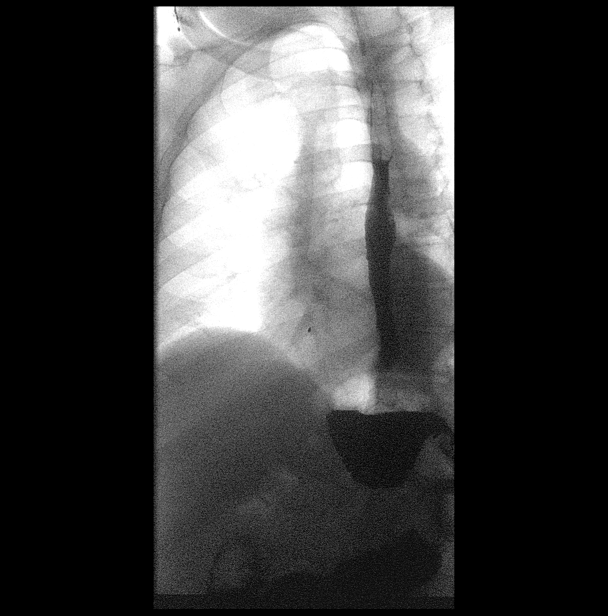

[Series 7: cp_standard · 0.30mm/px · 2 of 508 frames shown (6 of 7)]
[frame 98/508]
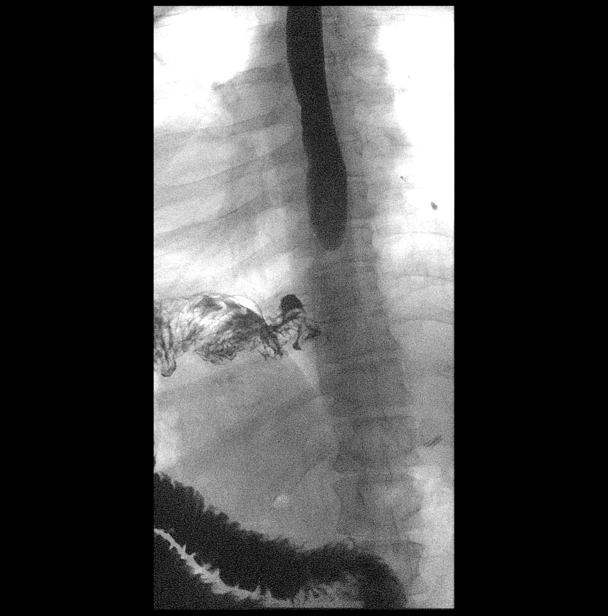
[frame 255/508]
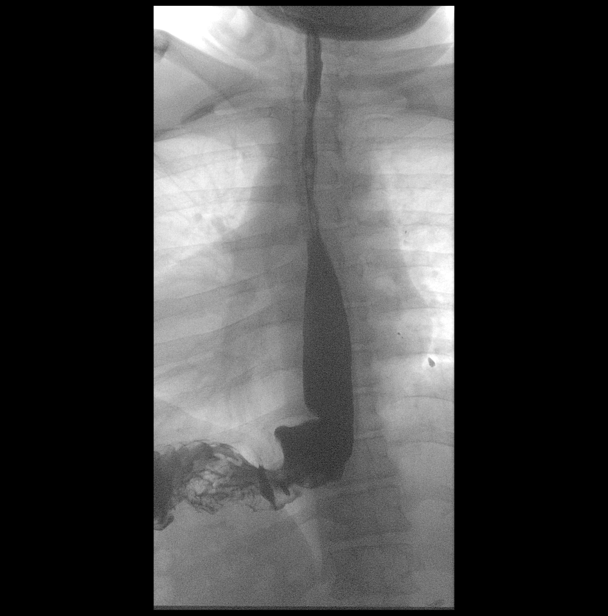

[Series 8: cp_standard · 0.31mm/px · 2 of 155 frames shown (7 of 7)]
[frame 52/155]
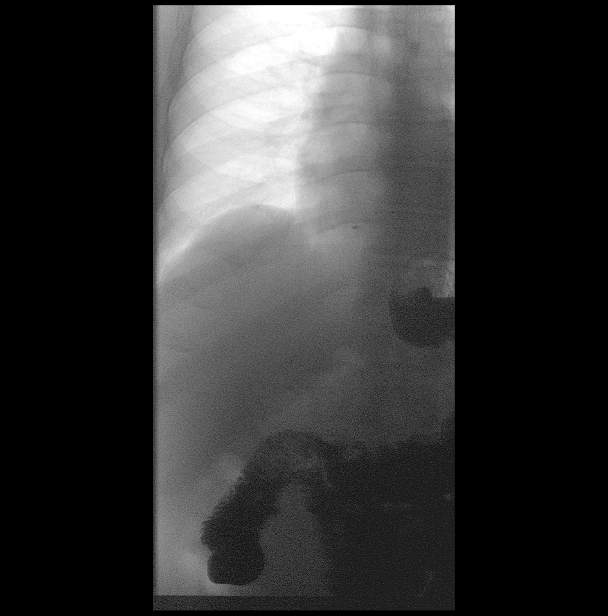
[frame 132/155]
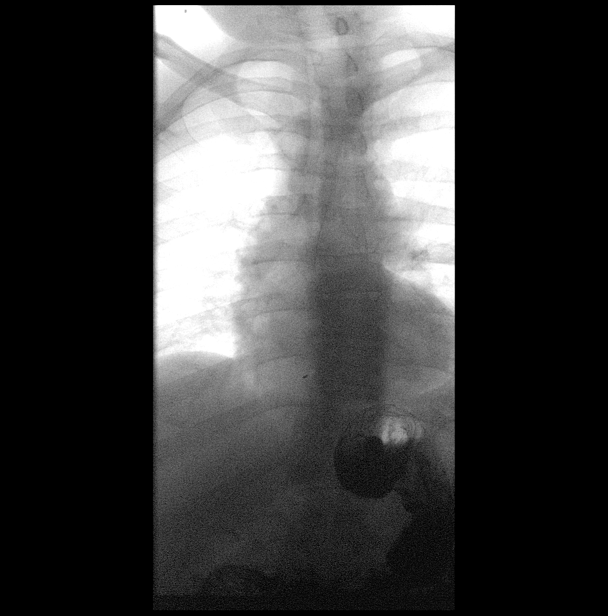

[14 of 24 positions shown; findings below may reference images not displayed]

FINDINGS: The hypopharynx demonstrated no filling defects and was normal in
contour. The esophagus was smooth in contour and demonstrated normal
motility. There was no narrowing or stricture. There was no delay in
the passage of a barium tablet into the stomach. There was a small
hiatal hernia. There was spontaneous gastroesophageal reflux to the
level of the upper third of the esophagus. Occasional tertiary
contractions were also noted
IMPRESSION: Gastroesophageal reflux to the level of the upper third of the
esophagus.

Small hiatal hernia.

Otherwise normal appearance of the esophagus.

## 2020-07-11 ENCOUNTER — Ambulatory Visit: Payer: Self-pay | Admitting: *Deleted

## 2020-07-11 NOTE — Telephone Encounter (Signed)
C/o covid symptoms SOB, chills, sore throat, fatigue, dizzy, cough,loss of taste and smell, N/V/D. Diarrhea started on Sunday night and other symptoms on Monday . Tested for covid via [employer] on Monday and test result negative per patient . Took 3 at home tests for covid and all 3 showed positive results. Denies difficulty breathing at this time. Isolation and quarantine precautions reviewed. Care advise given. Patient verbalized understanding of care advise and to go to urgent care or ED is symptoms worsen. Unable to make appt for PCP today office closed per patient.   Reason for Disposition . Fever present > 3 days (72 hours)  Answer Assessment - Initial Assessment Questions 1. COVID-19 DIAGNOSIS: "Who made your Coronavirus (COVID-19) diagnosis?" "Was it confirmed by a positive lab test?" If not diagnosed by a HCP, ask "Are there lots of cases (community spread) where you live?" (See public health department website, if unsure)     [facility] was negative and 3 home tests were positive. 2. COVID-19 EXPOSURE: "Was there any known exposure to COVID before the symptoms began?" CDC Definition of close contact: within 6 feet (2 meters) for a total of 15 minutes or more over a 24-hour period.      Unsure  3. ONSET: "When did the COVID-19 symptoms start?"      Sunday night diarrhea 4. WORST SYMPTOM: "What is your worst symptom?" (e.g., cough, fever, shortness of breath, muscle aches)     Fatigue  5. COUGH: "Do you have a cough?" If Yes, ask: "How bad is the cough?"       Yes mild 6. FEVER: "Do you have a fever?" If Yes, ask: "What is your temperature, how was it measured, and when did it start?"     Yes  7. RESPIRATORY STATUS: "Describe your breathing?" (e.g., shortness of breath, wheezing, unable to speak)      SOB, wheezing  8. BETTER-SAME-WORSE: "Are you getting better, staying the same or getting worse compared to yesterday?"  If getting worse, ask, "In what way?"     worse 9. HIGH RISK DISEASE:  "Do you have any chronic medical problems?" (e.g., asthma, heart or lung disease, weak immune system, obesity, etc.)     No  10. PREGNANCY: "Is there any chance you are pregnant?" "When was your last menstrual period?"       na 11. OTHER SYMPTOMS: "Do you have any other symptoms?"  (e.g., chills, fatigue, headache, loss of smell or taste, muscle pain, sore throat; new loss of smell or taste especially support the diagnosis of COVID-19)       Chills, fatigue, dizzy, loss of taste and smell, sore throat, N/V/D.  Protocols used: CORONAVIRUS (COVID-19) DIAGNOSED OR SUSPECTED-A-AH

## 2020-08-04 ENCOUNTER — Other Ambulatory Visit: Payer: Medicaid Other

## 2020-08-04 ENCOUNTER — Other Ambulatory Visit: Payer: Self-pay

## 2020-08-04 DIAGNOSIS — Z20822 Contact with and (suspected) exposure to covid-19: Secondary | ICD-10-CM

## 2020-08-05 LAB — NOVEL CORONAVIRUS, NAA: SARS-CoV-2, NAA: DETECTED — AB

## 2020-08-05 LAB — SARS-COV-2, NAA 2 DAY TAT

## 2020-12-09 ENCOUNTER — Other Ambulatory Visit: Payer: Medicaid Other

## 2020-12-15 ENCOUNTER — Other Ambulatory Visit: Payer: Medicaid Other

## 2022-02-01 ENCOUNTER — Ambulatory Visit
Admission: EM | Admit: 2022-02-01 | Discharge: 2022-02-01 | Disposition: A | Discharge status: Home / Self Care | Payer: Medicaid Other | Attending: Internal Medicine | Admitting: Internal Medicine

## 2022-02-01 ENCOUNTER — Encounter: Payer: Self-pay | Admitting: Emergency Medicine

## 2022-02-01 DIAGNOSIS — R6889 Other general symptoms and signs: Secondary | ICD-10-CM

## 2022-02-01 LAB — POCT INFLUENZA A/B
Influenza A, POC: NEGATIVE
Influenza B, POC: NEGATIVE

## 2022-02-01 MED ORDER — FLUTICASONE PROPIONATE 50 MCG/ACT NA SUSP: 2.0000 | Freq: Every day | NASAL | 0 refills | Status: DC

## 2022-02-01 NOTE — ED Triage Notes (Signed)
Pt here with sore throat and nasal congestion x 4 days. ?

## 2022-02-01 NOTE — Discharge Instructions (Addendum)
I sent Flonase for your nose congestion and post nasal drainage. ?We will call you if your covid test is positive. Stay quarantined until your covid test is back.  ? ?Since you get respiratory illnesses so much, have your vitamin D checked. To prevent frequent colds, should be 60-80. The closet to 100 the better your immune system and brain function will be.  ?

## 2022-02-01 NOTE — ED Provider Notes (Signed)
[facility]    CSN: [id number] Arrival date & time: 02/01/22  1450      History   Chief Complaint Chief Complaint  Patient presents with   Sore Throat   Nasal Congestion    HPI Derrick Doyle is a 46 y.o. male ST and cough and nose congestion x 4 days.Has had a fever up to 101 3 days ago nd  2 days. Had temp of 100.4 today at 3 am when he got up freezing.  Has not done covid surgery.  Denies change in appetite, but has been fatigued. Denies GI symptoms. Had covid x 3 , the last one was 7/'30  Has not ran a covid test at home. Has a kid at home  and has not had strep.  Has not had Covid injections. Did have a flu shot in 10/'21 He denies being sick with URI in the past month  Past Medical History:  Diagnosis Date   Allergy    Alpha-1-antitrypsin deficiency ([place])    Anxiety    Cardiomegaly    COPD (chronic obstructive pulmonary disease) ([place])    Depression    Dyspnea    GERD (gastroesophageal reflux disease)    Hypertension    Migraines    Nonrheumatic mitral (valve) insufficiency     Patient Active Problem List   Diagnosis Date Noted   Allergic rhinitis 01/22/2016   Low back pain 01/22/2016   Anxiety and depression 01/22/2016   Migraine without aura 01/22/2016   Tobacco use 01/22/2016   Hypertension 12/25/2015   S/P gastric bypass 12/25/2015   Finger joint stiff 07/18/2015   Carpal tunnel syndrome 07/02/2015   Hand numbness 07/02/2015    Past Surgical History:  Procedure Laterality Date   CHOLECYSTECTOMY  2012   COLONOSCOPY WITH PROPOFOL N/A 03/28/2018   Procedure: COLONOSCOPY WITH PROPOFOL;  Surgeon: Toledo, Benay Pike, MD;  Location: [facility];  Service: Gastroenterology;  Laterality: N/A;   ESOPHAGOGASTRODUODENOSCOPY (EGD) WITH PROPOFOL N/A 03/28/2018   Procedure: ESOPHAGOGASTRODUODENOSCOPY (EGD) WITH PROPOFOL;  Surgeon: Toledo, Benay Pike, MD;  Location: [facility] ENDOSCOPY;  Service: Gastroenterology;  Laterality: N/A;   ROUX-EN-Y PROCEDURE   2012   STRABISMUS SURGERY  2013       Home Medications    Prior to Admission medications   Medication Sig Start Date End Date Taking? Authorizing Provider  fluticasone (FLONASE) 50 MCG/ACT nasal spray Place 2 sprays into both nostrils daily. 02/01/22  Yes Rodriguez-Southworth, Sunday Spillers, PA-C  acetaminophen (TYLENOL) 500 MG tablet Take 1 tablet (500 mg total) by mouth every 6 (six) hours as needed. 08/06/16   Krebs, Genevie Cheshire, NP  buPROPion (WELLBUTRIN XL) 150 MG 24 hr tablet Take 1 tablet (150 mg total) by mouth daily. 07/20/16   Luciana Axe, NP  cetirizine (ZYRTEC) 10 MG tablet Take 10 mg by mouth daily.    [provider]  cyclobenzaprine (FLEXERIL) 10 MG tablet Take 1 tablet (10 mg total) by mouth 3 (three) times daily as needed for muscle spasms. 02/19/16   Luciana Axe, NP  EPIPEN 2-PAK 0.3 MG/0.3ML SOAJ injection use as directed by prescriber 09/26/15   [provider]  escitalopram (LEXAPRO) 20 MG tablet Take 1 tablet (20 mg total) by mouth daily. 02/19/16   Luciana Axe, NP  gabapentin (NEURONTIN) 300 MG capsule Take 1 capsule (300 mg total) by mouth 3 (three) times daily. prn 01/15/19   Caryn Section Linden Dolin, PA-C  ibuprofen (ADVIL,MOTRIN) 400 MG tablet Take 1 tablet (400 mg total)  by mouth every 6 (six) hours as needed. 08/06/16   Krebs, Genevie Cheshire, NP  losartan (COZAAR) 100 MG tablet Take 1 tablet (100 mg total) by mouth daily. 07/20/16   Krebs, Amy Lauren, NP  mometasone (NASONEX) 50 MCG/ACT nasal spray 2 sprays by Each Nare route Two (2) times a day.    [provider]  naproxen (NAPROSYN) 500 MG tablet Take 1 tablet (500 mg total) by mouth 2 (two) times daily with a meal. 02/19/16   Krebs, Amy Lauren, NP  omeprazole (PRILOSEC) 20 MG capsule Take 20 mg by mouth daily.    [provider]  ondansetron (ZOFRAN) 4 MG tablet Take 1 tablet (4 mg total) by mouth every 8 (eight) hours as needed for nausea or vomiting. 12/25/15   Luciana Axe, NP     Family History Family History  Adopted: Yes    Social History Social History   Tobacco Use   Smoking status: Former   Smokeless tobacco: Current    Types: Chew  Substance Use Topics   Alcohol use: Yes    Alcohol/week: 0.0 standard drinks    Comment: ocassional   Drug use: No     Allergies   Patient has no known allergies.   Review of Systems Review of Systems  Constitutional:  Positive for chills, diaphoresis, fatigue and fever. Negative for activity change and appetite change.  HENT:  Positive for ear pain, sinus pain and sore throat. Negative for ear discharge and rhinorrhea.   Respiratory:  Positive for cough. Negative for chest tightness.   Cardiovascular:  Negative for chest pain.  Musculoskeletal:  Positive for myalgias.    Physical Exam Triage Vital Signs ED Triage Vitals  Enc Vitals Group     BP 02/01/22 1556 (!) 145/79     Pulse Rate 02/01/22 1556 88     Resp 02/01/22 1556 18     Temp 02/01/22 1556 98.6 F (37 C)     Temp Source 02/01/22 1556 Oral     SpO2 02/01/22 1556 97 %     Weight --      Height --      Head Circumference --      Peak Flow --      Pain Score 02/01/22 1553 0     Pain Loc --      Pain Edu? --      Excl. in [place]? --    No data found.  Updated Vital Signs BP (!) 145/79 (BP Location: Left Arm)    Pulse 88    Temp 98.6 F (37 C) (Oral)    Resp 18    SpO2 97%   Visual Acuity Right Eye Distance:   Left Eye Distance:   Bilateral Distance:    Right Eye Near:   Left Eye Near:    Bilateral Near:      Physical Exam Vitals signs and nursing note reviewed.  Constitutional:      General: he is not in acute distress.    Appearance: Normal appearance. he is not ill-appearing, toxic-appearing or diaphoretic.  HENT:     Head: Normocephalic.     Right Ear: Tympanic membrane, ear canal and external ear normal.     Left Ear: Tympanic membrane, ear canal and external ear normal.     Nose: Nose normal.     Mouth/Throat: clear     Mouth: Mucous membranes are moist.  Eyes:     General: No scleral icterus.  Right eye: No discharge.        Left eye: No discharge.     Conjunctiva/sclera: Conjunctivae normal.  Neck:     Musculoskeletal: Neck supple. No neck rigidity.  Cardiovascular:     Rate and Rhythm: Normal rate and regular rhythm.     Heart sounds: No murmur.  Pulmonary:     Effort: Pulmonary effort is normal.     Breath sounds: Normal breath sounds.  Abdominal:     General: Bowel sounds are normal. There is no distension.     Palpations: Abdomen is soft. There is no mass.     Tenderness: There is no abdominal tenderness. There is no guarding or rebound.     Hernia: No hernia is present.  Musculoskeletal: Normal range of motion.  Lymphadenopathy:     Cervical: No cervical adenopathy.  Skin:    General: Skin is warm and dry.     Coloration: Skin is not jaundiced.     Findings: No rash.  Neurological:     Mental Status: he is alert and oriented to person, place, and time.     Gait: Gait normal.  Psychiatric:        Mood and Affect: Mood normal.        Behavior: Behavior normal.        Thought Content: Thought content normal.        Judgment: Judgment normal.    UC Treatments / Results  Labs (all labs ordered are listed, but only abnormal results are displayed) Labs Reviewed  POCT INFLUENZA A/B   Flu A&B negative EKG   Radiology No results found.  Procedures Procedures (including critical care time)  Medications Ordered in UC Medications - No data to display  Initial Impression / Assessment and Plan / UC Course  I have reviewed the triage vital signs and the nursing notes. Pertinent labs results that were available during my care of the patient were reviewed by me and considered in my medical decision making (see chart for details). Covid test is pending and we will call him if positive I sent Flonase to help with nose congestion and post nasal drainage.     Final Clinical  Impressions(s) / UC Diagnoses   Final diagnoses:  Flu-like symptoms     Discharge Instructions      I sent Flonase for your nose congestion and post nasal drainage. We will call you if your covid test is positive. Stay quarantined until your covid test is back.   Since you get respiratory illnesses so much, have your vitamin D checked. To prevent frequent colds, should be 60-80. The closet to 100 the better your immune system and brain function will be.      ED Prescriptions     Medication Sig Dispense Auth. Provider   fluticasone (FLONASE) 50 MCG/ACT nasal spray Place 2 sprays into both nostrils daily. 18.2 g Rodriguez-Southworth, Sunday Spillers, PA-C      PDMP not reviewed this encounter.   Shelby Mattocks, [place] 02/01/22 1733

## 2022-02-02 LAB — NOVEL CORONAVIRUS, NAA: SARS-CoV-2, NAA: NOT DETECTED

## 2022-06-18 ENCOUNTER — Other Ambulatory Visit: Payer: Self-pay | Admitting: Student

## 2022-06-18 ENCOUNTER — Ambulatory Visit
Admission: RE | Admit: 2022-06-18 | Discharge: 2022-06-18 | Disposition: A | Discharge status: Home / Self Care | Payer: 59 | Source: Ambulatory Visit | Attending: Student | Admitting: Student

## 2022-06-18 DIAGNOSIS — M25562 Pain in left knee: Secondary | ICD-10-CM | POA: Diagnosis present

## 2022-06-28 ENCOUNTER — Other Ambulatory Visit: Payer: Self-pay | Admitting: Internal Medicine

## 2022-06-28 DIAGNOSIS — M5116 Intervertebral disc disorders with radiculopathy, lumbar region: Secondary | ICD-10-CM

## 2022-07-04 ENCOUNTER — Ambulatory Visit
Admission: RE | Admit: 2022-07-04 | Discharge: 2022-07-04 | Disposition: A | Discharge status: Home / Self Care | Payer: 59 | Source: Ambulatory Visit | Attending: Internal Medicine | Admitting: Internal Medicine

## 2022-07-04 DIAGNOSIS — M5116 Intervertebral disc disorders with radiculopathy, lumbar region: Secondary | ICD-10-CM

## 2022-07-09 ENCOUNTER — Other Ambulatory Visit: Payer: Self-pay

## 2022-07-09 DIAGNOSIS — Z79899 Other long term (current) drug therapy: Secondary | ICD-10-CM | POA: Insufficient documentation

## 2022-07-09 DIAGNOSIS — Z7951 Long term (current) use of inhaled steroids: Secondary | ICD-10-CM | POA: Diagnosis not present

## 2022-07-09 DIAGNOSIS — J449 Chronic obstructive pulmonary disease, unspecified: Secondary | ICD-10-CM | POA: Insufficient documentation

## 2022-07-09 DIAGNOSIS — I10 Essential (primary) hypertension: Secondary | ICD-10-CM | POA: Insufficient documentation

## 2022-07-09 DIAGNOSIS — R32 Unspecified urinary incontinence: Secondary | ICD-10-CM | POA: Insufficient documentation

## 2022-07-09 DIAGNOSIS — M545 Low back pain, unspecified: Secondary | ICD-10-CM | POA: Diagnosis present

## 2022-07-09 LAB — CBC WITH DIFFERENTIAL/PLATELET
Abs Immature Granulocytes: 0.04 10*3/uL (ref 0.00–0.07)
Basophils Absolute: 0.1 10*3/uL (ref 0.0–0.1)
Basophils Relative: 1 %
Eosinophils Absolute: 0.1 10*3/uL (ref 0.0–0.5)
Eosinophils Relative: 1 %
HCT: 37.7 % — ABNORMAL LOW (ref 39.0–52.0)
Hemoglobin: 10.8 g/dL — ABNORMAL LOW (ref 13.0–17.0)
Immature Granulocytes: 0 %
Lymphocytes Relative: 16 %
Lymphs Abs: 1.5 10*3/uL (ref 0.7–4.0)
MCH: 20.8 pg — ABNORMAL LOW (ref 26.0–34.0)
MCHC: 28.6 g/dL — ABNORMAL LOW (ref 30.0–36.0)
MCV: 72.8 fL — ABNORMAL LOW (ref 80.0–100.0)
Monocytes Absolute: 0.9 10*3/uL (ref 0.1–1.0)
Monocytes Relative: 9 %
Neutro Abs: 7 10*3/uL (ref 1.7–7.7)
Neutrophils Relative %: 73 %
Platelets: 347 10*3/uL (ref 150–400)
RBC: 5.18 MIL/uL (ref 4.22–5.81)
RDW: 17.6 % — ABNORMAL HIGH (ref 11.5–15.5)
WBC: 9.6 10*3/uL (ref 4.0–10.5)
nRBC: 0 % (ref 0.0–0.2)

## 2022-07-09 LAB — COMPREHENSIVE METABOLIC PANEL
ALT: 95 U/L — ABNORMAL HIGH (ref 0–44)
AST: 58 U/L — ABNORMAL HIGH (ref 15–41)
Albumin: 4.3 g/dL (ref 3.5–5.0)
Alkaline Phosphatase: 71 U/L (ref 38–126)
Anion gap: 6 (ref 5–15)
BUN: 27 mg/dL — ABNORMAL HIGH (ref 6–20)
CO2: 26 mmol/L (ref 22–32)
Calcium: 9.4 mg/dL (ref 8.9–10.3)
Chloride: 106 mmol/L (ref 98–111)
Creatinine, Ser: 1.24 mg/dL (ref 0.61–1.24)
GFR, Estimated: >60 mL/min (ref >60)
Glucose, Bld: 98 mg/dL (ref 70–99)
Potassium: 4 mmol/L (ref 3.5–5.1)
Sodium: 138 mmol/L (ref 135–145)
Total Bilirubin: 0.3 mg/dL (ref 0.3–1.2)
Total Protein: 7.2 g/dL (ref 6.5–8.1)

## 2022-07-09 LAB — LIPASE, BLOOD: Lipase: 33 U/L (ref 11–51)

## 2022-07-09 NOTE — ED Triage Notes (Signed)
Patient reports he had MRI Sunday and was told he has a pinched nerve at L4. Reports back pain, nausea, vomiting, and new urinary and fecal incontinence for the past 36 hours.  Reports history of intermittent numbness and tingling but states he now has constant numbness from navel to feet. Ambulatory, resp even, unlabored on RA. Reports he also fell this morning onto left side, denies hitting head or LOC. Patient would not sit during triage, stated it was too painful.

## 2022-07-10 ENCOUNTER — Emergency Department: Payer: 59

## 2022-07-10 ENCOUNTER — Emergency Department
Admission: EM | Admit: 2022-07-10 | Discharge: 2022-07-10 | Disposition: A | Discharge status: Home / Self Care | Payer: 59 | Attending: Emergency Medicine | Admitting: Emergency Medicine

## 2022-07-10 ENCOUNTER — Encounter: Payer: Self-pay | Admitting: Radiology

## 2022-07-10 DIAGNOSIS — M545 Low back pain, unspecified: Secondary | ICD-10-CM

## 2022-07-10 LAB — URINALYSIS, ROUTINE W REFLEX MICROSCOPIC
Bilirubin Urine: NEGATIVE
Glucose, UA: NEGATIVE mg/dL
Hgb urine dipstick: NEGATIVE
Ketones, ur: NEGATIVE mg/dL
Leukocytes,Ua: NEGATIVE
Nitrite: NEGATIVE
Protein, ur: NEGATIVE mg/dL
Specific Gravity, Urine: 1.027 (ref 1.005–1.030)
pH: 5 (ref 5.0–8.0)

## 2022-07-10 MED ORDER — HYDROMORPHONE HCL 1 MG/ML IJ SOLN
1.0000 mg | Freq: Once | INTRAMUSCULAR | Status: AC
  Administered 2022-07-10: 1 mg via INTRAVENOUS
  Filled 2022-07-10: qty 1

## 2022-07-10 MED ORDER — LIDOCAINE 5 % EX PTCH: 1.0000 | MEDICATED_PATCH | CUTANEOUS | 0 refills | Status: AC

## 2022-07-10 MED ORDER — OXYCODONE-ACETAMINOPHEN 5-325 MG PO TABS: 2.0000 | ORAL_TABLET | Freq: Four times a day (QID) | ORAL | 0 refills | Status: DC | PRN

## 2022-07-10 MED ORDER — DEXAMETHASONE SODIUM PHOSPHATE 10 MG/ML IJ SOLN
10.0000 mg | Freq: Once | INTRAMUSCULAR | Status: AC
  Administered 2022-07-10: 10 mg via INTRAVENOUS
  Filled 2022-07-10: qty 1

## 2022-07-10 MED ORDER — ONDANSETRON 4 MG PO TBDP
4.0000 mg | ORAL_TABLET | Freq: Four times a day (QID) | ORAL | 0 refills | Status: AC | PRN
Start: 2022-07-10 — End: ?

## 2022-07-10 MED ORDER — ONDANSETRON HCL 4 MG/2ML IJ SOLN
4.0000 mg | Freq: Once | INTRAMUSCULAR | Status: AC
  Administered 2022-07-10: 4 mg via INTRAVENOUS
  Filled 2022-07-10: qty 2

## 2022-07-10 MED ORDER — SODIUM CHLORIDE 0.9 % IV BOLUS (SEPSIS)
1000.0000 mL | Freq: Once | INTRAVENOUS | Status: AC
  Administered 2022-07-10: 1000 mL via INTRAVENOUS

## 2022-07-10 MED ORDER — KETOROLAC TROMETHAMINE 30 MG/ML IJ SOLN
30.0000 mg | Freq: Once | INTRAMUSCULAR | Status: AC
  Administered 2022-07-10: 30 mg via INTRAVENOUS
  Filled 2022-07-10: qty 1

## 2022-07-10 NOTE — ED Provider Notes (Signed)
[facility] Provider Note    Event Date/Time   First MD Initiated Contact with Patient 07/10/22 0320     (approximate)   History   Urinary Incontinence, Encopresis, and Back Pain   HPI  Derrick Doyle is a 46 y.o. male with history of COPD, hypertension, migraines, depression who presents to the emergency department complaints of low back pain.  States he just had an outpatient MRI that showed that he had a "pinched nerve" at L4.  States recently he has developed bowel and bladder incontinence.  No numbness but states sometimes he feels tingling in his legs.  No focal weakness.  Is able to ambulate but movement causes increasing pain.  He denies any known injury.  No previous back surgeries or epidural injections.  No fever.  No history of IV drug abuse, cancer, HIV, diabetes.  He is taking gabapentin, diclofenac and hydrocodone without much relief.   History provided by patient.    Past Medical History:  Diagnosis Date   Allergy    Alpha-1-antitrypsin deficiency ([place])    Anxiety    Cardiomegaly    COPD (chronic obstructive pulmonary disease) (HCC)    Depression    Dyspnea    GERD (gastroesophageal reflux disease)    Hypertension    Migraines    Nonrheumatic mitral (valve) insufficiency     Past Surgical History:  Procedure Laterality Date   CHOLECYSTECTOMY  2012   COLONOSCOPY WITH PROPOFOL N/A 03/28/2018   Procedure: COLONOSCOPY WITH PROPOFOL;  Surgeon: Toledo, Benay Pike, MD;  Location: [facility] ENDOSCOPY;  Service: Gastroenterology;  Laterality: N/A;   ESOPHAGOGASTRODUODENOSCOPY (EGD) WITH PROPOFOL N/A 03/28/2018   Procedure: ESOPHAGOGASTRODUODENOSCOPY (EGD) WITH PROPOFOL;  Surgeon: Toledo, Benay Pike, MD;  Location: [facility] ENDOSCOPY;  Service: Gastroenterology;  Laterality: N/A;   ROUX-EN-Y PROCEDURE  2012   STRABISMUS SURGERY  2013    MEDICATIONS:  Prior to Admission medications   Medication Sig Start Date End Date Taking? Authorizing Provider   acetaminophen (TYLENOL) 500 MG tablet Take 1 tablet (500 mg total) by mouth every 6 (six) hours as needed. 08/06/16   Krebs, Genevie Cheshire, NP  buPROPion (WELLBUTRIN XL) 150 MG 24 hr tablet Take 1 tablet (150 mg total) by mouth daily. 07/20/16   Luciana Axe, NP  cetirizine (ZYRTEC) 10 MG tablet Take 10 mg by mouth daily.    [provider]  cyclobenzaprine (FLEXERIL) 10 MG tablet Take 1 tablet (10 mg total) by mouth 3 (three) times daily as needed for muscle spasms. 02/19/16   Luciana Axe, NP  EPIPEN 2-PAK 0.3 MG/0.3ML SOAJ injection use as directed by prescriber 09/26/15   [provider]  escitalopram (LEXAPRO) 20 MG tablet Take 1 tablet (20 mg total) by mouth daily. 02/19/16   Krebs, Genevie Cheshire, NP  fluticasone (FLONASE) 50 MCG/ACT nasal spray Place 2 sprays into both nostrils daily. 02/01/22   Rodriguez-Southworth, Sunday Spillers, PA-C  gabapentin (NEURONTIN) 300 MG capsule Take 1 capsule (300 mg total) by mouth 3 (three) times daily. prn 01/15/19   Caryn Section Linden Dolin, PA-C  ibuprofen (ADVIL,MOTRIN) 400 MG tablet Take 1 tablet (400 mg total) by mouth every 6 (six) hours as needed. 08/06/16   Krebs, Genevie Cheshire, NP  losartan (COZAAR) 100 MG tablet Take 1 tablet (100 mg total) by mouth daily. 07/20/16   Krebs, Amy Lauren, NP  mometasone (NASONEX) 50 MCG/ACT nasal spray 2 sprays by Each Nare route Two (2) times a day.    [provider]  naproxen (NAPROSYN) 500 MG tablet Take 1 tablet (500 mg total) by mouth 2 (two) times daily with a meal. 02/19/16   Krebs, Amy Lauren, NP  omeprazole (PRILOSEC) 20 MG capsule Take 20 mg by mouth daily.    [provider]  ondansetron (ZOFRAN) 4 MG tablet Take 1 tablet (4 mg total) by mouth every 8 (eight) hours as needed for nausea or vomiting. 12/25/15   Luciana Axe, NP    Physical Exam   Triage Vital Signs: ED Triage Vitals  Enc Vitals Group     BP 07/09/22 2139 (!) 162/98     Pulse Rate 07/09/22 2139 (!) 101     Resp 07/09/22  2139 20     Temp 07/09/22 2139 97.7 F (36.5 C)     Temp Source 07/09/22 2139 Oral     SpO2 07/09/22 2139 100 %     Weight 07/09/22 2140 168 lb 3.2 oz (76.3 kg)     Height 07/09/22 2140 '5\' 6"'$  (1.676 m)     Head Circumference --      Peak Flow --      Pain Score 07/09/22 2139 8     Pain Loc --      Pain Edu? --      Excl. in [place]? --     Most recent vital signs: Vitals:   07/10/22 0359 07/10/22 0700  BP:  (!) 156/93  Pulse: 82 69  Resp:  16  Temp:    SpO2: 100% 100%    CONSTITUTIONAL: Alert and oriented and responds appropriately to questions.  Appears uncomfortable. HEAD: Normocephalic, atraumatic EYES: Conjunctivae clear, pupils appear equal, sclera nonicteric ENT: normal nose; moist mucous membranes NECK: Supple, normal ROM CARD: RRR; S1 and S2 appreciated; no murmurs, no clicks, no rubs, no gallops RESP: Normal chest excursion without splinting or tachypnea; breath sounds clear and equal bilaterally; no wheezes, no rhonchi, no rales, no hypoxia or respiratory distress, speaking full sentences ABD/GI: Normal bowel sounds; non-distended; soft, non-tender, no rebound, no guarding, no peritoneal signs BACK: The back appears normal, tender throughout the lumbar spine.  No cervical spine tenderness.  No rash, ecchymosis, soft tissue swelling. EXT: Normal ROM in all joints; no deformity noted, no edema; no cyanosis SKIN: Normal color for age and race; warm; no rash on exposed skin NEURO: Moves all extremities equally, normal speech, normal reflexes in bilateral upper and lower extremities, no clonus, no hyperreflexia, normal gait, no saddle anesthesia, normal sensation diffusely, normal strength although exam limited due to patient's pain PSYCH: The patient's mood and manner are appropriate.   ED Results / Procedures / Treatments   LABS: (all labs ordered are listed, but only abnormal results are displayed) Labs Reviewed  CBC WITH DIFFERENTIAL/PLATELET - Abnormal; Notable for  the following components:      Result Value   Hemoglobin 10.8 (*)    HCT 37.7 (*)    MCV 72.8 (*)    MCH 20.8 (*)    MCHC 28.6 (*)    RDW 17.6 (*)    All other components within normal limits  COMPREHENSIVE METABOLIC PANEL - Abnormal; Notable for the following components:   BUN 27 (*)    AST 58 (*)    ALT 95 (*)    All other components within normal limits  URINALYSIS, ROUTINE W REFLEX MICROSCOPIC - Abnormal; Notable for the following components:   Color, Urine YELLOW (*)    APPearance CLEAR (*)    All other components within normal  limits  LIPASE, BLOOD     EKG:   RADIOLOGY: My personal review and interpretation of imaging: MRI showed no cauda equina, severe spinal stenosis or cord changes.  I have personally reviewed all radiology reports.   MR LUMBAR SPINE WO CONTRAST  Result Date: 07/10/2022 CLINICAL DATA:  47 year old male with back pain. Fecal incontinence for the past 36 hours. EXAM: MRI LUMBAR SPINE WITHOUT CONTRAST TECHNIQUE: Multiplanar, multisequence MR imaging of the lumbar spine was performed. No intravenous contrast was administered. COMPARISON:  Thoracic spine MRI today. Recent lumbar MRI 07/04/2022. FINDINGS: Segmentation: When numbering from the skull base and through the thoracic spine today the L5 level appears to be sacralized (rather than partially lumbarized S1-S2 as was designated earlier this month). Correlation with radiographs is recommended prior to any operative intervention. Alignment:  Unchanged lumbar lordosis. Vertebrae: No marrow edema or evidence of acute osseous abnormality. Visualized bone marrow signal is within normal limits. Conus medullaris and cauda equina: Conus extends to the L1-L2 level. No lower spinal cord or conus signal abnormality. Cauda equina nerve roots appears stable and within normal limits. Paraspinal and other soft tissues: Solitary right kidney again noted. Otherwise negative. Disc levels: Unchanged lumbar spine degeneration  since 07/04/2022. No significant lumbar spinal stenosis. The multi factorial lower lumbar neural foraminal stenosis described on 07/04/2022 is now designated to be the bilateral L3 (moderate) and L4 (mild-to-moderate) nerve levels. IMPRESSION: 1. Unchanged MRI appearance of the Lumbar Spine since 07/04/2022, aside from different numbering system today (when taking into account cervical and thoracic segmentation) designating a mostly sacralized L5 level. This differs from the lumbar numbering system used 6 days ago. Correlation with radiographs is recommended prior to any operative intervention. 2. No lumbar spinal stenosis. Moderate bilateral L3 and mild to moderate bilateral L5 degenerative neural foraminal stenosis. Electronically Signed   By: Genevie Ann M.D.   On: 07/10/2022 05:38   MR THORACIC SPINE WO CONTRAST  Result Date: 07/10/2022 CLINICAL DATA:  46 year old male with back pain. Fecal incontinence for the past 36 hours. EXAM: MRI THORACIC SPINE WITHOUT CONTRAST TECHNIQUE: Multiplanar, multisequence MR imaging of the thoracic spine was performed. No intravenous contrast was administered. COMPARISON:  Lumbar MRI 07/04/2022, and repeat lumbar MRI today. Brain MRI 05/17/2013. 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 Thoracic spine segmentation:  Appears to be normal. Alignment: Mild straightening of thoracic kyphosis. No significant spondylolisthesis. Vertebrae: T3 benign vertebral body hemangioma. Background bone marrow signal within normal limits. No marrow edema or evidence of acute osseous abnormality. Cord: No thoracic cord signal abnormality, and only mild degenerative thoracic spinal stenosis despite disc disease described below. Conus medullaris appears to remain normal at L1. Paraspinal and other soft tissues: Right T10-T11 nerve  root diverticulum suspected on series 22, image 31. Negative other thoracic paraspinal soft tissues. Negative visible chest and abdominal viscera aside from solitary right kidney. Disc levels: T1-T2: Negative. T2-T3: Negative. T3-T4: Mild disc bulging eccentric to the left.  No stenosis. T4-T5: Subtle disc bulging eccentric to the left.  No stenosis. T5-T6: Right paracentral disc bulge or protrusion (series 22, image 17). No stenosis. T6-T7: Negative. T7-T8: Broad-based paracentral protrusion on series 22, image 23. Effaced ventral CSF space, but patent dorsal thecal sac and no significant spinal stenosis despite mild ventral cord mass effect. T8-T9: Negative. T9-T10: Mild disc bulging. Mild facet hypertrophy greater on the right. No  significant stenosis. T10-T11: Circumferential disc bulge with a broad-based posterior component. Mild facet and ligament flavum hypertrophy. Mild spinal stenosis (series 22, image 32), mild if any cord mass effect. Right T10 nerve root diverticulum. T11-T12: Mild disc bulging and facet hypertrophy. No significant stenosis. T12-L1: Negative. IMPRESSION: 1. Multilevel thoracic disc disease, but capacious spinal canal at most levels and only mild thoracic spinal stenosis at T10-T11. Up to mild thoracic cord mass effect, but no spinal cord signal abnormality. 2. No significant thoracic foraminal stenosis. Incidental right T10 nerve root diverticulum (normal variant). Electronically Signed   By: Genevie Ann M.D.   On: 07/10/2022 05:34     PROCEDURES:  Critical Care performed: No   CRITICAL CARE Performed by: Cyril Mourning Jovon Winterhalter   Total critical care time: 0 minutes  Critical care time was exclusive of separately billable procedures and treating other patients.  Critical care was necessary to treat or prevent imminent or life-threatening deterioration.  Critical care was time spent personally by me on the following activities: development of treatment plan with patient and/or  surrogate as well as nursing, discussions with consultants, evaluation of patient's response to treatment, examination of patient, obtaining history from patient or surrogate, ordering and performing treatments and interventions, ordering and review of laboratory studies, ordering and review of radiographic studies, pulse oximetry and re-evaluation of patient's condition.   Procedures    IMPRESSION / MDM / ASSESSMENT AND PLAN / ED COURSE  I reviewed the triage vital signs and the nursing notes.    Patient here with increasing back pain and now states he is having urinary and fecal incontinence.     DIFFERENTIAL DIAGNOSIS (includes but not limited to):   Cauda equina, epidural hematoma, spinal stenosis.  Doubt osteomyelitis, discitis, epidural abscess, transverse myelitis, stroke.   Patient's presentation is most consistent with acute presentation with potential threat to life or bodily function.   PLAN: We will obtain CBC, BMP, urinalysis, postvoid residual, MRI of the thoracic and lumbar spine without contrast.  We will give pain medication.  We will keep n.p.o.   MEDICATIONS GIVEN IN ED: Medications  HYDROmorphone (DILAUDID) injection 1 mg (1 mg Intravenous Given 07/10/22 0341)  ondansetron (ZOFRAN) injection 4 mg (4 mg Intravenous Given 07/10/22 0341)  sodium chloride 0.9 % bolus 1,000 mL (0 mLs Intravenous Stopped 07/10/22 0651)  HYDROmorphone (DILAUDID) injection 1 mg (1 mg Intravenous Given 07/10/22 0619)  ketorolac (TORADOL) 30 MG/ML injection 30 mg (30 mg Intravenous Given 07/10/22 0620)  dexamethasone (DECADRON) injection 10 mg (10 mg Intravenous Given 07/10/22 8527)     ED COURSE: No leukocytosis seen on lab work.  Normal electrolytes and renal function.  Urine shows no sign of infection.  MRI is reviewed and interpreted by myself and the radiologist.  Patient has some mild thoracic spinal stenosis at T10-T11 with some mild thoracic cord mass effect but there is no spinal  cord signal changes.  He has multilevel thoracic disc disease.  MRI of the lumbar spine is unchanged compared to MRI on 07/04/2022.  He has no spinal stenosis in the lumbar area, cauda equina.  He again has signs of lumbar spine degeneration.  His pain has been much better controlled and he is no longer standing upright in the room.  He is able to sit down and relax and is smiling.  He does have a prescription for hydrocodone at home but he feels like this is not controlling his pain.  Have advised him to continue gabapentin, diclofenac and will  discharge with Percocet to take instead.  We will also discharge with Lidoderm patches.  Discussed rest, alternating heat and ice.  He did receive a dose of Decadron here in the emergency department but given no radicular symptoms or signs of radiculopathy on MRI, I do not think he would necessarily benefit from a prednisone taper.  I feel he is safe to be discharged home with close outpatient management of his PCP.  There is no sign of any acute abnormality that would suggest any neurosurgical reason for his reported bowel or bladder incontinence.  He has not had any urinary retention or overflow incontinence here.  At this time, I do not feel there is any life-threatening condition present. I reviewed all nursing notes, vitals, pertinent previous records.  All lab and urine results, EKGs, imaging ordered have been independently reviewed and interpreted by myself.  I reviewed all available radiology reports from any imaging ordered this visit.  Based on my assessment, I feel the patient is safe to be discharged home without further emergent workup and can continue workup as an outpatient as needed. Discussed all findings, treatment plan as well as usual and customary return precautions.  They verbalize understanding and are comfortable with this plan.  Outpatient follow-up has been provided as needed.  All questions have been answered.    CONSULTS: No emergent  neurosurgical consult needed as MRI shows no acute neurosurgical emergency.   OUTSIDE RECORDS REVIEWED: Reviewed patient's last note with Dr. Sabra Heck on 06/28/2022.       FINAL CLINICAL IMPRESSION(S) / ED DIAGNOSES   Final diagnoses:  Acute midline low back pain without sciatica     Rx / DC Orders   ED Discharge Orders          Ordered    oxyCODONE-acetaminophen (PERCOCET) 5-325 MG tablet  Every 6 hours PRN        07/10/22 0642    ondansetron (ZOFRAN-ODT) 4 MG disintegrating tablet  Every 6 hours PRN        07/10/22 0642    lidocaine (LIDODERM) 5 %  Every 24 hours        07/10/22 7943             Note:  This document was prepared using Dragon voice recognition software and may include unintentional dictation errors.   Demian Maisel, Delice Bison, DO 07/10/22 1210

## 2022-07-10 NOTE — Discharge Instructions (Addendum)
You are being provided a prescription for opiates (also known as narcotics) for pain control.  Opiates can be addictive and should only be used when absolutely necessary for pain control when other alternatives do not work.  We recommend you only use them for the recommended amount of time and only as prescribed.  Please do not take with other sedative medications or alcohol.  Please do not drive, operate machinery, make important decisions while taking opiates.  Please note that these medications can be addictive and have high abuse potential.  Patients can become addicted to narcotics after only taking them for a few days.  Please keep these medications locked away from children, teenagers or any family members with history of substance abuse.  Narcotic pain medicine may also make you constipated.  You may use over-the-counter medications such as MiraLAX, Colace to prevent constipation.  If you become constipated, you may use over-the-counter enemas as needed.  Itching and nausea are also common side effects of narcotic pain medication.  If you develop uncontrolled vomiting or a rash, please stop these medications and seek medical care.   We have provided you with a prescription for Percocet.  Please do not take your hydrocodone at the same time with this medication as they are both narcotics.  Percocet is stronger and may help with your pain better than the hydrocodone.  Please continue your diclofenac as prescribed.

## 2022-09-06 ENCOUNTER — Ambulatory Visit
Admission: RE | Admit: 2022-09-06 | Discharge: 2022-09-06 | Disposition: A | Discharge status: Home / Self Care | Payer: 59 | Source: Ambulatory Visit | Attending: Emergency Medicine | Admitting: Emergency Medicine

## 2022-09-06 VITALS — BP 137/91 | HR 105 | Temp 98.2°F | Resp 18

## 2022-09-06 DIAGNOSIS — Z79899 Other long term (current) drug therapy: Secondary | ICD-10-CM | POA: Insufficient documentation

## 2022-09-06 DIAGNOSIS — R051 Acute cough: Secondary | ICD-10-CM | POA: Diagnosis present

## 2022-09-06 DIAGNOSIS — R030 Elevated blood-pressure reading, without diagnosis of hypertension: Secondary | ICD-10-CM | POA: Diagnosis not present

## 2022-09-06 DIAGNOSIS — B349 Viral infection, unspecified: Secondary | ICD-10-CM

## 2022-09-06 DIAGNOSIS — J101 Influenza due to other identified influenza virus with other respiratory manifestations: Secondary | ICD-10-CM | POA: Insufficient documentation

## 2022-09-06 DIAGNOSIS — I10 Essential (primary) hypertension: Secondary | ICD-10-CM | POA: Diagnosis present

## 2022-09-06 DIAGNOSIS — Z1152 Encounter for screening for COVID-19: Secondary | ICD-10-CM | POA: Diagnosis not present

## 2022-09-06 LAB — RESP PANEL BY RT-PCR (FLU A&B, COVID) ARPGX2
Influenza A by PCR: POSITIVE — AB
Influenza B by PCR: NEGATIVE
SARS Coronavirus 2 by RT PCR: NEGATIVE

## 2022-09-06 MED ORDER — ALBUTEROL SULFATE HFA 108 (90 BASE) MCG/ACT IN AERS: 1.0000 | INHALATION_SPRAY | Freq: Four times a day (QID) | RESPIRATORY_TRACT | 0 refills | Status: DC | PRN

## 2022-09-06 NOTE — ED Triage Notes (Signed)
Patient presents to UC for chills, body aches, weakness, cough, and fever x 2 days. Treating symptoms with tylenol, ibuprofen, benadryl, albuterol inhaler, and herbal supplements.

## 2022-09-06 NOTE — Discharge Instructions (Addendum)
Use the albuterol inhaler as directed.  Your COVID and Flu tests are pending. Follow up with your primary care provider if your symptoms are not improving.    Your blood pressure is elevated today at 153/91.  Please have this rechecked by your primary care provider in 2-4 weeks.

## 2022-09-06 NOTE — ED Provider Notes (Signed)
[facility]    CSN: [id number] Arrival date & time: 09/06/22  1155      History   Chief Complaint Chief Complaint  Patient presents with   Generalized Body Aches    Sore throat, body aches cough fever, chills weakness - Entered by patient   Sore Throat   Fever   Chills   Weakness    HPI Derrick Doyle is a 46 y.o. male.  Patient presents with 2-day history of fever, chills, body aches, generalized weakness, cough, vomiting, diarrhea.  Treatment at home with Tylenol, ibuprofen, Benadryl, albuterol inhaler, and herbal supplements.  Patient states he needs a refill on albuterol inhaler.  No rash, sore throat, chest pain, shortness of breath, or other symptoms.  One episode of emesis and no diarrhea today.  His medical history includes COPD (patient denies this), cardiomegaly, mitral valve insufficiency, hypertension, allergic rhinitis, migraine headaches.  The history is provided by the patient and medical records.    Past Medical History:  Diagnosis Date   Allergy    Alpha-1-antitrypsin deficiency ([place])    Anxiety    Cardiomegaly    COPD (chronic obstructive pulmonary disease) (HCC)    Depression    Dyspnea    GERD (gastroesophageal reflux disease)    Hypertension    Migraines    Nonrheumatic mitral (valve) insufficiency     Patient Active Problem List   Diagnosis Date Noted   Allergic rhinitis 01/22/2016   Low back pain 01/22/2016   Anxiety and depression 01/22/2016   Migraine without aura 01/22/2016   Tobacco use 01/22/2016   Hypertension 12/25/2015   S/P gastric bypass 12/25/2015   Finger joint stiff 07/18/2015   Carpal tunnel syndrome 07/02/2015   Hand numbness 07/02/2015    Past Surgical History:  Procedure Laterality Date   CHOLECYSTECTOMY  2012   COLONOSCOPY WITH PROPOFOL N/A 03/28/2018   Procedure: COLONOSCOPY WITH PROPOFOL;  Surgeon: Toledo, Benay Pike, MD;  Location: [facility];  Service: Gastroenterology;  Laterality: N/A;    ESOPHAGOGASTRODUODENOSCOPY (EGD) WITH PROPOFOL N/A 03/28/2018   Procedure: ESOPHAGOGASTRODUODENOSCOPY (EGD) WITH PROPOFOL;  Surgeon: Toledo, Benay Pike, MD;  Location: [facility];  Service: Gastroenterology;  Laterality: N/A;   ROUX-EN-Y PROCEDURE  2012   STRABISMUS SURGERY  2013       Home Medications    Prior to Admission medications   Medication Sig Start Date End Date Taking? Authorizing Provider  albuterol (VENTOLIN HFA) 108 (90 Base) MCG/ACT inhaler Inhale 1-2 puffs into the lungs every 6 (six) hours as needed. 09/06/22  Yes Sharion Balloon, NP  acetaminophen (TYLENOL) 500 MG tablet Take 1 tablet (500 mg total) by mouth every 6 (six) hours as needed. 08/06/16   Krebs, Genevie Cheshire, NP  buPROPion (WELLBUTRIN XL) 150 MG 24 hr tablet Take 1 tablet (150 mg total) by mouth daily. 07/20/16   Luciana Axe, NP  cetirizine (ZYRTEC) 10 MG tablet Take 10 mg by mouth daily.    [provider]  cyclobenzaprine (FLEXERIL) 10 MG tablet Take 1 tablet (10 mg total) by mouth 3 (three) times daily as needed for muscle spasms. 02/19/16   Luciana Axe, NP  diclofenac (VOLTAREN) 75 MG EC tablet Take 75 mg by mouth 2 (two) times daily. 08/28/22   [provider]  EPIPEN 2-PAK 0.3 MG/0.3ML SOAJ injection use as directed by prescriber 09/26/15   [provider]  escitalopram (LEXAPRO) 20 MG tablet Take 1 tablet (20 mg total) by mouth daily. 02/19/16   Krebs,  Amy Lauren, NP  fluticasone (FLONASE) 50 MCG/ACT nasal spray Place 2 sprays into both nostrils daily. 02/01/22   Rodriguez-Southworth, Sunday Spillers, PA-C  gabapentin (NEURONTIN) 300 MG capsule Take 1 capsule (300 mg total) by mouth 3 (three) times daily. prn 01/15/19   Caryn Section Linden Dolin, PA-C  ibuprofen (ADVIL,MOTRIN) 400 MG tablet Take 1 tablet (400 mg total) by mouth every 6 (six) hours as needed. 08/06/16   Krebs, Genevie Cheshire, NP  losartan (COZAAR) 100 MG tablet Take 1 tablet (100 mg total) by mouth daily. 07/20/16   Krebs, Amy Lauren, NP   mometasone (NASONEX) 50 MCG/ACT nasal spray 2 sprays by Each Nare route Two (2) times a day.    [provider]  naproxen (NAPROSYN) 500 MG tablet Take 1 tablet (500 mg total) by mouth 2 (two) times daily with a meal. 02/19/16   Krebs, Amy Lauren, NP  omeprazole (PRILOSEC) 20 MG capsule Take 20 mg by mouth daily.    [provider]  ondansetron (ZOFRAN) 4 MG tablet Take 1 tablet (4 mg total) by mouth every 8 (eight) hours as needed for nausea or vomiting. 12/25/15   Luciana Axe, NP  ondansetron (ZOFRAN-ODT) 4 MG disintegrating tablet Take 1 tablet (4 mg total) by mouth every 6 (six) hours as needed for nausea or vomiting. 07/10/22   Ward, Delice Bison, DO  oxyCODONE-acetaminophen (PERCOCET) 5-325 MG tablet Take 2 tablets by mouth every 6 (six) hours as needed for severe pain. 07/10/22 07/10/23  Ward, Delice Bison, DO    Family History Family History  Adopted: Yes    Social History Social History   Tobacco Use   Smoking status: Former   Smokeless tobacco: Current    Types: Chew  Substance Use Topics   Alcohol use: Yes    Alcohol/week: 0.0 standard drinks of alcohol    Comment: ocassional   Drug use: No     Allergies   Bee venom and Molds & smuts   Review of Systems Review of Systems  Constitutional:  Positive for chills, fatigue and fever.  HENT:  Positive for ear pain. Negative for sore throat.   Respiratory:  Positive for cough. Negative for shortness of breath.   Cardiovascular:  Negative for chest pain and palpitations.  Gastrointestinal:  Positive for diarrhea and vomiting. Negative for abdominal pain.  Skin:  Negative for rash.  All other systems reviewed and are negative.    Physical Exam Triage Vital Signs ED Triage Vitals  Enc Vitals Group     BP      Pulse      Resp      Temp      Temp src      SpO2      Weight      Height      Head Circumference      Peak Flow      Pain Score      Pain Loc      Pain Edu?      Excl. in [place]?    No  data found.  Updated Vital Signs BP (!) 137/91   Pulse (!) 105   Temp 98.2 F (36.8 C)   Resp 18   SpO2 98%   Visual Acuity Right Eye Distance:   Left Eye Distance:   Bilateral Distance:    Right Eye Near:   Left Eye Near:    Bilateral Near:     Physical Exam Vitals and nursing note reviewed.  Constitutional:  General: He is not in acute distress.    Appearance: Normal appearance. He is well-developed. He is not ill-appearing.  HENT:     Right Ear: Tympanic membrane normal.     Left Ear: Tympanic membrane normal.     Nose: Nose normal.     Mouth/Throat:     Mouth: Mucous membranes are moist.     Pharynx: Oropharynx is clear.  Cardiovascular:     Rate and Rhythm: Normal rate and regular rhythm.     Heart sounds: Normal heart sounds.  Pulmonary:     Effort: Pulmonary effort is normal. No respiratory distress.     Breath sounds: Normal breath sounds.  Abdominal:     General: Bowel sounds are normal.     Palpations: Abdomen is soft.     Tenderness: There is no abdominal tenderness. There is no guarding or rebound.  Musculoskeletal:     Cervical back: Neck supple.  Skin:    General: Skin is warm and dry.  Neurological:     Mental Status: He is alert.  Psychiatric:        Mood and Affect: Mood normal.        Behavior: Behavior normal.      UC Treatments / Results  Labs (all labs ordered are listed, but only abnormal results are displayed) Labs Reviewed  RESP PANEL BY RT-PCR (FLU A&B, COVID) ARPGX2    EKG   Radiology No results found.  Procedures Procedures (including critical care time)  Medications Ordered in UC Medications - No data to display  Initial Impression / Assessment and Plan / UC Course  I have reviewed the triage vital signs and the nursing notes.  Pertinent labs & imaging results that were available during my care of the patient were reviewed by me and considered in my medical decision making (see chart for details).   Viral  illness. Elevated blood pressure with hypertension.  COVID and Flu pending.  Treating with albuterol inhaler.  Discussed symptomatic treatment including Tylenol, rest, hydration.  Work note provided.  Instructed patient to follow up with his PCP if his symptoms are not improving.  Also discussed with patient that his blood pressure is elevated today and needs to be rechecked by PCP in 2 to 4 weeks.  Education provided on managing hypertension.  He agrees to plan of care.    Final Clinical Impressions(s) / UC Diagnoses   Final diagnoses:  Viral illness  Elevated blood pressure reading in office with diagnosis of hypertension     Discharge Instructions      Use the albuterol inhaler as directed.  Your COVID and Flu tests are pending. Follow up with your primary care provider if your symptoms are not improving.    Your blood pressure is elevated today at 153/91.  Please have this rechecked by your primary care provider in 2-4 weeks.          ED Prescriptions     Medication Sig Dispense Auth. Provider   albuterol (VENTOLIN HFA) 108 (90 Base) MCG/ACT inhaler Inhale 1-2 puffs into the lungs every 6 (six) hours as needed. 18 g Sharion Balloon, NP      PDMP not reviewed this encounter.   Sharion Balloon, NP 09/06/22 1251

## 2022-09-07 ENCOUNTER — Telehealth: Payer: Self-pay

## 2022-09-07 MED ORDER — OSELTAMIVIR PHOSPHATE 75 MG PO CAPS: 75.0000 mg | ORAL_CAPSULE | Freq: Two times a day (BID) | ORAL | 0 refills | Status: DC

## 2022-09-07 NOTE — Telephone Encounter (Signed)
Patient called regarding positive flu A test results. Order for tamiflu sent to preferred pharmacy. Work note extended.

## 2022-12-24 ENCOUNTER — Telehealth: Payer: Self-pay | Admitting: *Deleted

## 2022-12-24 NOTE — Telephone Encounter (Signed)
Nurse placed call to patient to review appointment details for upcoming new patient consultation visit. Patient confirmed appointment date and time. Patient denies any further questions or concerns regarding appointment.

## 2022-12-27 ENCOUNTER — Inpatient Hospital Stay: Payer: 59 | Attending: Oncology | Admitting: Oncology

## 2022-12-27 ENCOUNTER — Encounter: Payer: Self-pay | Admitting: Oncology

## 2022-12-27 ENCOUNTER — Inpatient Hospital Stay: Payer: 59

## 2022-12-27 VITALS — BP 141/91 | HR 79 | Temp 96.6°F | Resp 16 | Ht 66.0 in | Wt 182.0 lb

## 2022-12-27 DIAGNOSIS — Z9884 Bariatric surgery status: Secondary | ICD-10-CM | POA: Diagnosis not present

## 2022-12-27 DIAGNOSIS — Z87891 Personal history of nicotine dependence: Secondary | ICD-10-CM | POA: Diagnosis not present

## 2022-12-27 DIAGNOSIS — D509 Iron deficiency anemia, unspecified: Secondary | ICD-10-CM

## 2022-12-27 DIAGNOSIS — I10 Essential (primary) hypertension: Secondary | ICD-10-CM | POA: Diagnosis not present

## 2022-12-27 LAB — IRON AND TIBC
Iron: 39 ug/dL — ABNORMAL LOW (ref 45–182)
Saturation Ratios: 7 % — ABNORMAL LOW (ref 17.9–39.5)
TIBC: 533 ug/dL — ABNORMAL HIGH (ref 250–450)
UIBC: 494 ug/dL

## 2022-12-27 LAB — FOLATE: Folate: 20.2 ng/mL (ref >5.9)

## 2022-12-27 LAB — CBC
HCT: 37.3 % — ABNORMAL LOW (ref 39.0–52.0)
Hemoglobin: 11.4 g/dL — ABNORMAL LOW (ref 13.0–17.0)
MCH: 21.7 pg — ABNORMAL LOW (ref 26.0–34.0)
MCHC: 30.6 g/dL (ref 30.0–36.0)
MCV: 71 fL — ABNORMAL LOW (ref 80.0–100.0)
Platelets: 287 10*3/uL (ref 150–400)
RBC: 5.25 MIL/uL (ref 4.22–5.81)
RDW: 16.6 % — ABNORMAL HIGH (ref 11.5–15.5)
WBC: 7.2 10*3/uL (ref 4.0–10.5)
nRBC: 0 % (ref 0.0–0.2)

## 2022-12-27 LAB — VITAMIN B12: Vitamin B-12: 545 pg/mL (ref 180–914)

## 2022-12-27 LAB — FERRITIN: Ferritin: 3 ng/mL — ABNORMAL LOW (ref 24–336)

## 2022-12-27 LAB — DAT, POLYSPECIFIC AHG (ARMC ONLY): Polyspecific AHG test: NEGATIVE

## 2022-12-27 LAB — LACTATE DEHYDROGENASE: LDH: 124 U/L (ref 98–192)

## 2022-12-27 NOTE — Progress Notes (Signed)
[place]  [phone] [phone]  ID: Derrick Doyle OB: 04-Sep-1976  MR#: [id number]  NKN#:[id number]  Patient Care Team: Rusty Aus, MD as PCP - General (Internal Medicine)  CHIEF COMPLAINT: Iron deficiency anemia.  INTERVAL HISTORY: Patient is a 47 year old male with a history of gastric bypass surgery who was noted to have a declining hemoglobin and iron stores on routine blood work.  He is referred for evaluation and consideration of IV iron.  He currently feels well.  He has no neurologic complaints.  He denies any recent fevers or illnesses.  He has good appetite and denies weight loss.  He has no chest pain, shortness of breath, cough, or hemoptysis.  He denies any nausea, vomiting, constipation, or diarrhea.  He has no urinary complaints.  Patient offers no specific complaints today.    REVIEW OF SYSTEMS:   Review of Systems  Constitutional: Negative.  Negative for fever, malaise/fatigue and weight loss.  Respiratory: Negative.  Negative for cough, hemoptysis and shortness of breath.   Cardiovascular: Negative.  Negative for chest pain and leg swelling.  Gastrointestinal: Negative.  Negative for abdominal pain, blood in stool and melena.  Genitourinary: Negative.  Negative for hematuria.  Musculoskeletal: Negative.  Negative for back pain.  Skin: Negative.  Negative for rash.  Neurological: Negative.  Negative for dizziness, focal weakness, weakness and headaches.  Psychiatric/Behavioral: Negative.  The patient is not nervous/anxious.     As per HPI. Otherwise, a complete review of systems is negative.  PAST MEDICAL HISTORY: Past Medical History:  Diagnosis Date   Allergy    Alpha-1-antitrypsin deficiency ([place])    Anxiety    Cardiomegaly    COPD (chronic obstructive pulmonary disease) (HCC)    Depression    Dyspnea    GERD (gastroesophageal reflux disease)    Hypertension    Migraines    Nonrheumatic mitral (valve)  insufficiency     PAST SURGICAL HISTORY: Past Surgical History:  Procedure Laterality Date   CHOLECYSTECTOMY  2012   COLONOSCOPY WITH PROPOFOL N/A 03/28/2018   Procedure: COLONOSCOPY WITH PROPOFOL;  Surgeon: Toledo, Benay Pike, MD;  Location: [facility];  Service: Gastroenterology;  Laterality: N/A;   ESOPHAGOGASTRODUODENOSCOPY (EGD) WITH PROPOFOL N/A 03/28/2018   Procedure: ESOPHAGOGASTRODUODENOSCOPY (EGD) WITH PROPOFOL;  Surgeon: Toledo, Benay Pike, MD;  Location: [facility];  Service: Gastroenterology;  Laterality: N/A;   EYE SURGERY     Strabismus   HERNIA REPAIR  2020   ROUX-EN-Y PROCEDURE  2012   STRABISMUS SURGERY  2013    FAMILY HISTORY: Family History  Adopted: Yes    ADVANCED DIRECTIVES (Y/N):  N  HEALTH MAINTENANCE: Social History   Tobacco Use   Smoking status: Former   Smokeless tobacco: Current    Types: Chew  Substance Use Topics   Alcohol use: Not Currently    Comment: ocassional   Drug use: No     Colonoscopy:  PAP:  Bone density:  Lipid panel:  Allergies  Allergen Reactions   Bee Venom Anaphylaxis   Molds & Smuts Itching    DUST:   ITCHING TREES: ITCHING DUST:   ITCHING TREES: ITCHING     Current Outpatient Medications  Medication Sig Dispense Refill   diclofenac (VOLTAREN) 75 MG EC tablet Take 75 mg by mouth 2 (two) times daily.     gabapentin (NEURONTIN) 300 MG capsule Take 1 capsule (300 mg total) by mouth 3 (three) times daily. prn 20 capsule 0   losartan (COZAAR) 100 MG  tablet Take 1 tablet (100 mg total) by mouth daily. (Patient taking differently: Take 50 mg by mouth daily.) 90 tablet 3   omeprazole (PRILOSEC) 20 MG capsule Take 20 mg by mouth daily.     ondansetron (ZOFRAN) 4 MG tablet Take 1 tablet (4 mg total) by mouth every 8 (eight) hours as needed for nausea or vomiting. 20 tablet 0   ondansetron (ZOFRAN-ODT) 4 MG disintegrating tablet Take 1 tablet (4 mg total) by mouth every 6 (six) hours as needed for nausea or  vomiting. 20 tablet 0   sertraline (ZOLOFT) 50 MG tablet Take 50 mg by mouth daily.     No current facility-administered medications for this visit.    OBJECTIVE: Vitals:   12/27/22 1142  BP: (!) 141/91  Pulse: 79  Resp: 16  Temp: (!) 96.6 F (35.9 C)  SpO2: 100%     Body mass index is 29.38 kg/m.    ECOG FS:0 - Asymptomatic  General: Well-developed, well-nourished, no acute distress. Eyes: Pink conjunctiva, anicteric sclera. HEENT: Normocephalic, moist mucous membranes. Lungs: No audible wheezing or coughing. Heart: Regular rate and rhythm. Abdomen: Soft, nontender, no obvious distention. Musculoskeletal: No edema, cyanosis, or clubbing. Neuro: Alert, answering all questions appropriately. Cranial nerves grossly intact. Skin: No rashes or petechiae noted. Psych: Normal affect. Lymphatics: No cervical, calvicular, axillary or inguinal LAD.   LAB RESULTS:  Lab Results  Component Value Date   NA 138 07/09/2022   K 4.0 07/09/2022   CL 106 07/09/2022   CO2 26 07/09/2022   GLUCOSE 98 07/09/2022   BUN 27 (H) 07/09/2022   CREATININE 1.24 07/09/2022   CALCIUM 9.4 07/09/2022   PROT 7.2 07/09/2022   ALBUMIN 4.3 07/09/2022   AST 58 (H) 07/09/2022   ALT 95 (H) 07/09/2022   ALKPHOS 71 07/09/2022   BILITOT 0.3 07/09/2022   GFRNONAA >60 07/09/2022   GFRAA >60 01/10/2019    Lab Results  Component Value Date   WBC 7.2 12/27/2022   NEUTROABS 7.0 07/09/2022   HGB 11.4 (L) 12/27/2022   HCT 37.3 (L) 12/27/2022   MCV 71.0 (L) 12/27/2022   PLT 287 12/27/2022   Lab Results  Component Value Date   FERRITIN 3 (L) 12/27/2022   Lab Results  Component Value Date   IRON 39 (L) 12/27/2022   TIBC 533 (H) 12/27/2022   IRONPCTSAT 7 (L) 12/27/2022     STUDIES: No results found.  ASSESSMENT: Iron deficiency anemia.  PLAN:    Iron deficiency anemia: Likely secondary to poor absorption from history of gastric bypass.  Patient's hemoglobin is only mildly decreased at 11.4,  but he has significantly reduced iron stores.  Patient will return to clinic 5 times over the next 3 weeks to receive 200 mg IV Venofer.  He would then return to clinic in 4 months with repeat laboratory work, further evaluation, and continuation of treatment if needed.  I spent a total of 45 minutes reviewing chart data, face-to-face evaluation with the patient, counseling and coordination of care as detailed above.   Patient expressed understanding and was in agreement with this plan. He also understands that He can call clinic at any time with any questions, concerns, or complaints.    Lloyd Huger, MD   12/27/2022 2:23 PM

## 2022-12-28 LAB — HAPTOGLOBIN: Haptoglobin: 125 mg/dL (ref 23–355)

## 2023-01-04 ENCOUNTER — Inpatient Hospital Stay: Payer: 59

## 2023-01-04 VITALS — BP 120/57 | HR 62 | Resp 16

## 2023-01-04 DIAGNOSIS — D509 Iron deficiency anemia, unspecified: Secondary | ICD-10-CM | POA: Diagnosis not present

## 2023-01-04 MED ORDER — SODIUM CHLORIDE 0.9 % IV SOLN
Freq: Once | INTRAVENOUS | Status: AC
  Filled 2023-01-04: qty 250

## 2023-01-04 MED ORDER — SODIUM CHLORIDE 0.9 % IV SOLN
200.0000 mg | Freq: Once | INTRAVENOUS | Status: AC
  Administered 2023-01-04: 200 mg via INTRAVENOUS
  Filled 2023-01-04: qty 200

## 2023-01-05 ENCOUNTER — Inpatient Hospital Stay: Payer: 59

## 2023-01-06 MED FILL — Iron Sucrose Inj 20 MG/ML (Fe Equiv): INTRAVENOUS | Qty: 10 | Status: AC

## 2023-01-07 ENCOUNTER — Inpatient Hospital Stay: Payer: 59

## 2023-01-07 VITALS — BP 139/81 | HR 71 | Temp 97.8°F

## 2023-01-07 DIAGNOSIS — D509 Iron deficiency anemia, unspecified: Secondary | ICD-10-CM

## 2023-01-07 MED ORDER — SODIUM CHLORIDE 0.9 % IV SOLN
Freq: Once | INTRAVENOUS | Status: AC
  Filled 2023-01-07: qty 250

## 2023-01-07 MED ORDER — SODIUM CHLORIDE 0.9 % IV SOLN
200.0000 mg | Freq: Once | INTRAVENOUS | Status: AC
  Administered 2023-01-07: 200 mg via INTRAVENOUS
  Filled 2023-01-07: qty 200

## 2023-01-07 NOTE — Patient Instructions (Signed)

## 2023-01-10 ENCOUNTER — Inpatient Hospital Stay: Payer: 59

## 2023-01-10 VITALS — BP 132/79 | HR 80 | Temp 97.9°F | Resp 16

## 2023-01-10 DIAGNOSIS — D509 Iron deficiency anemia, unspecified: Secondary | ICD-10-CM | POA: Diagnosis not present

## 2023-01-10 MED ORDER — FAMOTIDINE 20 MG PO TABS
20.0000 mg | ORAL_TABLET | Freq: Once | ORAL | Status: DC
  Filled 2023-01-10: qty 1

## 2023-01-10 MED ORDER — SODIUM CHLORIDE 0.9 % IV SOLN
200.0000 mg | Freq: Once | INTRAVENOUS | Status: AC
  Administered 2023-01-10: 200 mg via INTRAVENOUS
  Filled 2023-01-10: qty 200

## 2023-01-10 MED ORDER — SODIUM CHLORIDE 0.9 % IV SOLN
Freq: Once | INTRAVENOUS | Status: AC
  Filled 2023-01-10: qty 250

## 2023-01-10 MED ORDER — DIPHENHYDRAMINE HCL 25 MG PO CAPS
25.0000 mg | ORAL_CAPSULE | Freq: Once | ORAL | Status: AC
  Administered 2023-01-10: 25 mg via ORAL
  Filled 2023-01-10: qty 1

## 2023-01-10 MED ORDER — DEXAMETHASONE 4 MG PO TABS
10.0000 mg | ORAL_TABLET | Freq: Once | ORAL | Status: AC
  Administered 2023-01-10: 10 mg via ORAL
  Filled 2023-01-10: qty 3

## 2023-01-10 MED ORDER — FAMOTIDINE IN NACL 20-0.9 MG/50ML-% IV SOLN
20.0000 mg | Freq: Once | INTRAVENOUS | Status: AC
  Administered 2023-01-10: 20 mg via INTRAVENOUS
  Filled 2023-01-10: qty 50

## 2023-01-10 MED ORDER — ACETAMINOPHEN 325 MG PO TABS
650.0000 mg | ORAL_TABLET | Freq: Once | ORAL | Status: AC
  Administered 2023-01-10: 650 mg via ORAL
  Filled 2023-01-10: qty 2

## 2023-01-10 NOTE — Progress Notes (Signed)
Pt is here for 3rd iron infusion. While he does endorse increased energy levels overall since starting iron however reports flushing and burning/itching sensations for about 36 hours after receiving iron. PT denies any SOB and denies taking benadryl for reaction. Dr. Grayland Ormond made aware. 636m Tylenol, 21mBenadryl, 209mepcid, 60m75mxamethasone ordered.

## 2023-01-12 ENCOUNTER — Inpatient Hospital Stay: Payer: 59

## 2023-01-13 MED FILL — Iron Sucrose Inj 20 MG/ML (Fe Equiv): INTRAVENOUS | Qty: 10 | Status: AC

## 2023-01-14 ENCOUNTER — Inpatient Hospital Stay: Payer: 59

## 2023-01-17 ENCOUNTER — Inpatient Hospital Stay: Payer: 59

## 2023-01-17 VITALS — BP 135/66 | HR 87 | Temp 97.1°F | Resp 16

## 2023-01-17 DIAGNOSIS — D509 Iron deficiency anemia, unspecified: Secondary | ICD-10-CM

## 2023-01-17 MED ORDER — SODIUM CHLORIDE 0.9 % IV SOLN
Freq: Once | INTRAVENOUS | Status: AC
  Filled 2023-01-17: qty 250

## 2023-01-17 MED ORDER — SODIUM CHLORIDE 0.9 % IV SOLN
200.0000 mg | Freq: Once | INTRAVENOUS | Status: AC
  Administered 2023-01-17: 200 mg via INTRAVENOUS
  Filled 2023-01-17: qty 200

## 2023-01-17 NOTE — Patient Instructions (Signed)

## 2023-01-17 NOTE — Progress Notes (Signed)
Pt refused premedications and post observation period due to another appointment at 1400.   Pt tolerated treatment well. VSS.  No concerns.

## 2023-01-20 MED FILL — Iron Sucrose Inj 20 MG/ML (Fe Equiv): INTRAVENOUS | Qty: 10 | Status: AC

## 2023-01-21 ENCOUNTER — Inpatient Hospital Stay: Payer: 59 | Attending: Oncology

## 2023-03-16 ENCOUNTER — Encounter: Payer: Self-pay | Admitting: Gastroenterology

## 2023-03-16 NOTE — H&P (Signed)
Pre-Procedure H&P   Patient ID: Derrick Doyle is a 47 y.o. male.  Gastroenterology Provider: Jaynie Collins, DO  Referring Provider: Jacob Moores, PA PCP: Danella Penton, MD  Date: 03/17/2023  HPI [name] is a 47 y.o. male who presents today for Esophagogastroduodenoscopy and Colonoscopy for Personal history colon polyps, IDA .  Patient status post gastric bypass-Roux-en-Y and cholecystectomy.  Alpha 1 antitrypsin deficiency.  Patient with longstanding history of iron deficiency anemia.  Most recent lab work hemoglobin 11.4 MCV 74 ferritin 30 saturation 7% TIBC 533.  Undergoing IV iron infusions which has helped his fatigue and pica.  He reports bowel movement every 2 to 3 days without melena hematochezia diarrhea or constipation.  No abdominal pain weight changes or appetite changes.  He does have GERD which is well-controlled on PPI- but he reports he does not need it all that often.  No dysphagia or dyne aphasia  Last underwent an EGD and colonoscopy in May 2019.  The former of which was normal.  He did have 2 adenomatous polyps as well as internal hemorrhoids  Once weekly diclofenac use.  Past Medical History:  Diagnosis Date   Allergy    Alpha-1-antitrypsin deficiency    Anxiety    Cardiomegaly    COPD (chronic obstructive pulmonary disease)    Depression    Dyspnea    GERD (gastroesophageal reflux disease)    Hypertension    Migraines    Nonrheumatic mitral (valve) insufficiency     Past Surgical History:  Procedure Laterality Date   CHOLECYSTECTOMY  2012   COLONOSCOPY WITH PROPOFOL N/A 03/28/2018   Procedure: COLONOSCOPY WITH PROPOFOL;  Surgeon: Toledo, Boykin Nearing, MD;  Location: [facility];  Service: Gastroenterology;  Laterality: N/A;   ESOPHAGOGASTRODUODENOSCOPY (EGD) WITH PROPOFOL N/A 03/28/2018   Procedure: ESOPHAGOGASTRODUODENOSCOPY (EGD) WITH PROPOFOL;  Surgeon: Toledo, Boykin Nearing, MD;  Location: [facility];  Service:  Gastroenterology;  Laterality: N/A;   EYE SURGERY     Strabismus   HERNIA REPAIR  2020   ROUX-EN-Y PROCEDURE  2012   STRABISMUS SURGERY  2013    Family History Unknown- patient is adopted  Review of Systems  Constitutional:  Negative for activity change, appetite change, chills, diaphoresis, fatigue, fever and unexpected weight change.  HENT:  Negative for trouble swallowing and voice change.   Respiratory:  Negative for shortness of breath and wheezing.   Cardiovascular:  Negative for chest pain, palpitations and leg swelling.  Gastrointestinal:  Negative for abdominal distention, abdominal pain, anal bleeding, blood in stool, constipation, diarrhea, nausea and vomiting.  Musculoskeletal:  Negative for arthralgias and myalgias.  Skin:  Negative for color change and pallor.  Neurological:  Negative for dizziness, syncope and weakness.  Psychiatric/Behavioral:  Negative for confusion. The patient is not nervous/anxious.   All other systems reviewed and are negative.    Medications No current facility-administered medications on file prior to encounter.   Current Outpatient Medications on File Prior to Encounter  Medication Sig Dispense Refill   diclofenac (VOLTAREN) 75 MG EC tablet Take 75 mg by mouth 2 (two) times daily.     gabapentin (NEURONTIN) 300 MG capsule Take 1 capsule (300 mg total) by mouth 3 (three) times daily. prn 20 capsule 0   losartan (COZAAR) 100 MG tablet Take 1 tablet (100 mg total) by mouth daily. (Patient taking differently: Take 50 mg by mouth daily.) 90 tablet 3   omeprazole (PRILOSEC) 20 MG capsule Take 20 mg by mouth daily.  ondansetron (ZOFRAN) 4 MG tablet Take 1 tablet (4 mg total) by mouth every 8 (eight) hours as needed for nausea or vomiting. 20 tablet 0   ondansetron (ZOFRAN-ODT) 4 MG disintegrating tablet Take 1 tablet (4 mg total) by mouth every 6 (six) hours as needed for nausea or vomiting. 20 tablet 0   sertraline (ZOLOFT) 50 MG tablet Take 50  mg by mouth daily.      Pertinent medications related to GI and procedure were reviewed by me with the patient prior to the procedure   Current Facility-Administered Medications:    0.9 %  sodium chloride infusion, , Intravenous, Continuous, Jaynie Collins, DO      Allergies  Allergen Reactions   Bee Venom Anaphylaxis   Molds & Smuts Itching    DUST:   ITCHING TREES: ITCHING DUST:   ITCHING TREES: ITCHING    Allergies were reviewed by me prior to the procedure  Objective   There is no height or weight on file to calculate BMI. There were no vitals filed for this visit.   Physical Exam Vitals and nursing note reviewed.  Constitutional:      General: He is not in acute distress.    Appearance: Normal appearance. He is not ill-appearing, toxic-appearing or diaphoretic.  HENT:     Head: Normocephalic and atraumatic.     Nose: Nose normal.     Mouth/Throat:     Mouth: Mucous membranes are moist.     Pharynx: Oropharynx is clear.  Eyes:     General: No scleral icterus.    Extraocular Movements: Extraocular movements intact.  Cardiovascular:     Rate and Rhythm: Normal rate and regular rhythm.     Heart sounds: Normal heart sounds. No murmur heard.    No friction rub. No gallop.  Pulmonary:     Effort: Pulmonary effort is normal. No respiratory distress.     Breath sounds: Normal breath sounds. No wheezing, rhonchi or rales.  Abdominal:     General: Bowel sounds are normal. There is no distension.     Palpations: Abdomen is soft.     Tenderness: There is no abdominal tenderness. There is no guarding or rebound.  Musculoskeletal:     Cervical back: Neck supple.     Right lower leg: No edema.     Left lower leg: No edema.  Skin:    General: Skin is warm and dry.     Coloration: Skin is not jaundiced or pale.  Neurological:     General: No focal deficit present.     Mental Status: He is alert and oriented to person, place, and time. Mental status is at  baseline.  Psychiatric:        Mood and Affect: Mood normal.        Behavior: Behavior normal.        Thought Content: Thought content normal.        Judgment: Judgment normal.      Assessment:  [name] is a 47 y.o. male  who presents today for Esophagogastroduodenoscopy and Colonoscopy for Personal history colon polyps, IDA .  Plan:  Esophagogastroduodenoscopy and Colonoscopy with possible intervention today  Esophagogastroduodenoscopy and Colonoscopy with possible biopsy, control of bleeding, polypectomy, and interventions as necessary has been discussed with the patient/patient representative. Informed consent was obtained from the patient/patient representative after explaining the indication, nature, and risks of the procedure including but not limited to death, bleeding, perforation, missed neoplasm/lesions, cardiorespiratory compromise, and  reaction to medications. Opportunity for questions was given and appropriate answers were provided. Patient/patient representative has verbalized understanding is amenable to undergoing the procedure.   Jaynie Collins, DO  [facility] Gastroenterology  Portions of the record may have been created with voice recognition software. Occasional wrong-word or 'sound-a-like' substitutions may have occurred due to the inherent limitations of voice recognition software.  Read the chart carefully and recognize, using context, where substitutions may have occurred.

## 2023-03-17 ENCOUNTER — Ambulatory Visit: Payer: 59 | Admitting: Anesthesiology

## 2023-03-17 ENCOUNTER — Ambulatory Visit
Admission: RE | Admit: 2023-03-17 | Discharge: 2023-03-17 | Disposition: A | Discharge status: Home / Self Care | Payer: 59 | Attending: Gastroenterology | Admitting: Gastroenterology

## 2023-03-17 ENCOUNTER — Encounter
Admission: RE | Disposition: A | Discharge status: Home / Self Care | Payer: Self-pay | Source: Home / Self Care | Attending: Gastroenterology

## 2023-03-17 ENCOUNTER — Encounter: Payer: Self-pay | Admitting: Gastroenterology

## 2023-03-17 ENCOUNTER — Other Ambulatory Visit: Payer: Self-pay

## 2023-03-17 DIAGNOSIS — F32A Depression, unspecified: Secondary | ICD-10-CM | POA: Diagnosis not present

## 2023-03-17 DIAGNOSIS — Z87891 Personal history of nicotine dependence: Secondary | ICD-10-CM | POA: Diagnosis not present

## 2023-03-17 DIAGNOSIS — K640 First degree hemorrhoids: Secondary | ICD-10-CM | POA: Insufficient documentation

## 2023-03-17 DIAGNOSIS — K635 Polyp of colon: Secondary | ICD-10-CM | POA: Insufficient documentation

## 2023-03-17 DIAGNOSIS — Q438 Other specified congenital malformations of intestine: Secondary | ICD-10-CM | POA: Insufficient documentation

## 2023-03-17 DIAGNOSIS — J449 Chronic obstructive pulmonary disease, unspecified: Secondary | ICD-10-CM | POA: Insufficient documentation

## 2023-03-17 DIAGNOSIS — K219 Gastro-esophageal reflux disease without esophagitis: Secondary | ICD-10-CM | POA: Diagnosis not present

## 2023-03-17 DIAGNOSIS — D509 Iron deficiency anemia, unspecified: Secondary | ICD-10-CM | POA: Diagnosis not present

## 2023-03-17 DIAGNOSIS — I10 Essential (primary) hypertension: Secondary | ICD-10-CM | POA: Diagnosis not present

## 2023-03-17 DIAGNOSIS — Z9049 Acquired absence of other specified parts of digestive tract: Secondary | ICD-10-CM | POA: Insufficient documentation

## 2023-03-17 DIAGNOSIS — E8801 Alpha-1-antitrypsin deficiency: Secondary | ICD-10-CM | POA: Diagnosis not present

## 2023-03-17 DIAGNOSIS — F419 Anxiety disorder, unspecified: Secondary | ICD-10-CM | POA: Diagnosis not present

## 2023-03-17 DIAGNOSIS — Z9884 Bariatric surgery status: Secondary | ICD-10-CM | POA: Insufficient documentation

## 2023-03-17 HISTORY — PX: ESOPHAGOGASTRODUODENOSCOPY: SHX5428

## 2023-03-17 HISTORY — PX: COLONOSCOPY: SHX5424

## 2023-03-17 SURGERY — COLONOSCOPY
Anesthesia: General

## 2023-03-17 MED ORDER — DEXMEDETOMIDINE HCL IN NACL 80 MCG/20ML IV SOLN
INTRAVENOUS | Status: DC | PRN
  Administered 2023-03-17: 12 ug via INTRAVENOUS
  Administered 2023-03-17: 8 ug via INTRAVENOUS

## 2023-03-17 MED ORDER — MIDAZOLAM HCL 2 MG/2ML IJ SOLN
INTRAMUSCULAR | Status: AC
  Filled 2023-03-17: qty 2

## 2023-03-17 MED ORDER — MIDAZOLAM HCL 5 MG/5ML IJ SOLN
INTRAMUSCULAR | Status: DC | PRN
  Administered 2023-03-17: 2 mg via INTRAVENOUS

## 2023-03-17 MED ORDER — SODIUM CHLORIDE 0.9 % IV SOLN: INTRAVENOUS | Status: DC

## 2023-03-17 MED ORDER — EPHEDRINE SULFATE (PRESSORS) 50 MG/ML IJ SOLN
INTRAMUSCULAR | Status: DC | PRN
  Administered 2023-03-17 (×2): 10 mg via INTRAVENOUS

## 2023-03-17 MED ORDER — LIDOCAINE HCL (CARDIAC) PF 100 MG/5ML IV SOSY
PREFILLED_SYRINGE | INTRAVENOUS | Status: DC | PRN
  Administered 2023-03-17: 100 mg via INTRAVENOUS

## 2023-03-17 MED ORDER — PROPOFOL 500 MG/50ML IV EMUL
INTRAVENOUS | Status: DC | PRN
  Administered 2023-03-17: 50 mg via INTRAVENOUS
  Administered 2023-03-17: 150 ug/kg/min via INTRAVENOUS

## 2023-03-17 NOTE — Interval H&P Note (Signed)
History and Physical Interval Note: Preprocedure H&P from 03/17/23  was reviewed and there was no interval change after seeing and examining the patient.  Written consent was obtained from the patient after discussion of risks, benefits, and alternatives. Patient has consented to proceed with Esophagogastroduodenoscopy and Colonoscopy with possible intervention   03/17/2023 12:34 PM  Derrick Doyle  has presented today for surgery, with the diagnosis of Iron deficiency anemia following bariatric surgery (K95.89,D50.8) Hx of adenomatous colonic polyps (Z86.010) GERD without esophagitis (K21.9).  The various methods of treatment have been discussed with the patient and family. After consideration of risks, benefits and other options for treatment, the patient has consented to  Procedure(s): COLONOSCOPY (N/A) ESOPHAGOGASTRODUODENOSCOPY (EGD) (N/A) as a surgical intervention.  The patient's history has been reviewed, patient examined, no change in status, stable for surgery.  I have reviewed the patient's chart and labs.  Questions were answered to the patient's satisfaction.     Jaynie Collins

## 2023-03-17 NOTE — Anesthesia Preprocedure Evaluation (Signed)
Anesthesia Evaluation  Patient identified by MRN, date of birth, ID band Patient awake    Reviewed: Allergy & Precautions, NPO status , Patient's Chart, lab work & pertinent test results  History of Anesthesia Complications Negative for: history of anesthetic complications  Airway Mallampati: III  TM Distance: >3 FB Neck ROM: full    Dental  (+) Chipped   Pulmonary neg shortness of breath, COPD, former smoker   Pulmonary exam normal        Cardiovascular Exercise Tolerance: Good hypertension, (-) angina Normal cardiovascular exam     Neuro/Psych  Headaches  Neuromuscular disease  negative psych ROS   GI/Hepatic Neg liver ROS,GERD  Controlled,,  Endo/Other  negative endocrine ROS    Renal/GU negative Renal ROS  negative genitourinary   Musculoskeletal   Abdominal   Peds  Hematology negative hematology ROS (+)   Anesthesia Other Findings Past Medical History: No date: Allergy No date: Alpha-1-antitrypsin deficiency No date: Anxiety No date: Cardiomegaly No date: COPD (chronic obstructive pulmonary disease) No date: Depression No date: Dyspnea No date: GERD (gastroesophageal reflux disease) No date: Hypertension No date: Migraines No date: Nonrheumatic mitral (valve) insufficiency  Past Surgical History: 2012: CHOLECYSTECTOMY 03/28/2018: COLONOSCOPY WITH PROPOFOL; N/A     Comment:  Procedure: COLONOSCOPY WITH PROPOFOL;  Surgeon: Toledo,               Boykin Nearing, MD;  Location: [facility] ENDOSCOPY;  Service:               Gastroenterology;  Laterality: N/A; 03/28/2018: ESOPHAGOGASTRODUODENOSCOPY (EGD) WITH PROPOFOL; N/A     Comment:  Procedure: ESOPHAGOGASTRODUODENOSCOPY (EGD) WITH               PROPOFOL;  Surgeon: Toledo, Boykin Nearing, MD;  Location:               [facility] ENDOSCOPY;  Service: Gastroenterology;  Laterality:               N/A; No date: EYE SURGERY     Comment:  Strabismus 2020: HERNIA REPAIR 2012:  ROUX-EN-Y PROCEDURE 2013: STRABISMUS SURGERY  BMI    Body Mass Index: 28.89 kg/m      Reproductive/Obstetrics negative OB ROS                             Anesthesia Physical Anesthesia Plan  ASA: 3  Anesthesia Plan: General   Post-op Pain Management:    Induction: Intravenous  PONV Risk Score and Plan: Propofol infusion and TIVA  Airway Management Planned: Natural Airway and Nasal Cannula  Additional Equipment:   Intra-op Plan:   Post-operative Plan:   Informed Consent: I have reviewed the patients History and Physical, chart, labs and discussed the procedure including the risks, benefits and alternatives for the proposed anesthesia with the patient or authorized representative who has indicated his/her understanding and acceptance.     Dental Advisory Given  Plan Discussed with: Anesthesiologist, CRNA and Surgeon  Anesthesia Plan Comments: (Patient consented for risks of anesthesia including but not limited to:  - adverse reactions to medications - risk of airway placement if required - damage to eyes, teeth, lips or other oral mucosa - nerve damage due to positioning  - sore throat or hoarseness - Damage to heart, brain, nerves, lungs, other parts of body or loss of life  Patient voiced understanding.)       Anesthesia Quick Evaluation

## 2023-03-17 NOTE — Anesthesia Postprocedure Evaluation (Signed)
Anesthesia Post Note  Patient: Derrick Doyle  Procedure(s) Performed: COLONOSCOPY ESOPHAGOGASTRODUODENOSCOPY (EGD)  Patient location during evaluation: PACU Anesthesia Type: General Level of consciousness: awake and alert Pain management: pain level controlled Vital Signs Assessment: post-procedure vital signs reviewed and stable Respiratory status: spontaneous breathing, nonlabored ventilation and respiratory function stable Cardiovascular status: blood pressure returned to baseline and stable Postop Assessment: no apparent nausea or vomiting Anesthetic complications: no   There were no known notable events for this encounter.   Last Vitals:  Vitals:   03/17/23 1453 03/17/23 1501  BP: 112/70 (!) 147/66  Pulse:    Resp:    Temp:    SpO2:      Last Pain:  Vitals:   03/17/23 1501  TempSrc:   PainSc: 0-No pain                 Foye Deer

## 2023-03-17 NOTE — Transfer of Care (Signed)
Immediate Anesthesia Transfer of Care Note  Patient: Derrick Doyle  Procedure(s) Performed: COLONOSCOPY ESOPHAGOGASTRODUODENOSCOPY (EGD)  Patient Location: PACU  Anesthesia Type:General  Level of Consciousness: drowsy  Airway & Oxygen Therapy: Patient Spontanous Breathing and Patient connected to nasal cannula oxygen  Post-op Assessment: Report given to RN and Post -op Vital signs reviewed and stable  Post vital signs: stable  Last Vitals:  Vitals Value Taken Time  BP 90/56 03/17/23 1445  Temp 36.4 C 03/17/23 1436  Pulse 68 03/17/23 1447  Resp 16 03/17/23 1447  SpO2 96 % 03/17/23 1447  Vitals shown include unvalidated device data.  Last Pain:  Vitals:   03/17/23 1436  TempSrc: Temporal  PainSc:          Complications: No notable events documented.

## 2023-03-17 NOTE — Op Note (Addendum)
[facility] Gastroenterology Patient Name: Derrick Doyle Procedure Date: 03/17/2023 1:47 PM MRN: [id number] Account #: [id number] Date of Birth: November 09, 1976 Admit Type: Outpatient Age: 47 Room: [facility] ENDO ROOM 1 Gender: Male Note Status: Supervisor Override Instrument Name: Upper Endoscope [id number] Procedure:             Upper GI endoscopy Indications:           Iron deficiency anemia Providers:             Jaynie Collins DO, DO Referring MD:          Danella Penton, MD (Referring MD) Medicines:             Monitored Anesthesia Care Complications:         No immediate complications. Estimated blood loss:                         Minimal. Procedure:             Pre-Anesthesia Assessment:                        - Prior to the procedure, a History and Physical was                         performed, and patient medications and allergies were                         reviewed. The patient is competent. The risks and                         benefits of the procedure and the sedation options and                         risks were discussed with the patient. All questions                         were answered and informed consent was obtained.                         Patient identification and proposed procedure were                         verified by the physician, the nurse, the                         anesthesiologist and the technician in the endoscopy                         suite. Mental Status Examination: alert and oriented.                         Airway Examination: normal oropharyngeal airway and                         neck mobility. Respiratory Examination: clear to                         auscultation. CV Examination: RRR, no murmurs, no S3  or S4. Prophylactic Antibiotics: The patient does not                         require prophylactic antibiotics. Prior                         Anticoagulants: The patient has taken no  anticoagulant                         or antiplatelet agents. ASA Grade Assessment: III - A                         patient with severe systemic disease. After reviewing                         the risks and benefits, the patient was deemed in                         satisfactory condition to undergo the procedure. The                         anesthesia plan was to use monitored anesthesia care                         (MAC). Immediately prior to administration of                         medications, the patient was re-assessed for adequacy                         to receive sedatives. The heart rate, respiratory                         rate, oxygen saturations, blood pressure, adequacy of                         pulmonary ventilation, and response to care were                         monitored throughout the procedure. The physical                         status of the patient was re-assessed after the                         procedure.                        After obtaining informed consent, the endoscope was                         passed under direct vision. Throughout the procedure,                         the patient's blood pressure, pulse, and oxygen                         saturations were monitored continuously. The Endoscope  was introduced through the mouth, and advanced to the                         second part of duodenum. The upper GI endoscopy was                         accomplished without difficulty. The patient tolerated                         the procedure well. 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 This was characterized by healthy appearing mucosa. Estimated       blood loss: none.      Normal mucosa was found in the entire examined stomach. Biopsies were       taken with a cold forceps for Helicobacter pylori testing. Estimated       blood loss was minimal. Retroflexion not  performed due to small remnant       gastric pouch Estimated blood loss: none.      The examined jejunum was normal. Biopsies for histology were taken with       a cold forceps for evaluation of celiac disease. Estimated blood loss       was minimal.      The Z-line was regular. Estimated blood loss: none.      Esophagogastric landmarks were identified: the gastroesophageal junction       was found at 36 cm from the incisors.      The exam of the esophagus was otherwise normal. 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                        - Normal mucosa was found in the entire stomach.                         Biopsied.                        - Normal examined jejunum. Biopsied.                        - Z-line regular.                        - Esophagogastric landmarks identified. Recommendation:        - Patient has a contact number available for                         emergencies. The signs and symptoms of potential                         delayed complications were discussed with the patient.                         Return to normal activities tomorrow. Written  discharge instructions were provided to the patient.                        - Discharge patient to home.                        - Resume previous diet.                        - Continue present medications.                        - Await pathology results.                        - Return to GI clinic as previously scheduled.                        - proceed with colonoscopy                        - The findings and recommendations were discussed with                         the patient. Procedure Code(s):     --- Professional ---                        [phone], Esophagogastroduodenoscopy, flexible,                         transoral; with biopsy, single or multiple Diagnosis Code(s):     --- Professional ---                         Z98.84, Bariatric surgery status                        D50.9, Iron deficiency anemia, unspecified CPT copyright 2022 American Medical Association. All rights reserved. The codes documented in this report are preliminary and upon coder review may  be revised to meet current compliance requirements. Attending Participation:      I personally performed the entire procedure. Elfredia Nevins, DO Jaynie Collins DO, DO 03/17/2023 2:04:49 PM This report has been signed electronically. Number of Addenda: 0 Note Initiated On: 03/17/2023 1:47 PM Estimated Blood Loss:  Estimated blood loss was minimal.      [facility]

## 2023-03-17 NOTE — Op Note (Addendum)
[facility] Gastroenterology Patient Name: Derrick Doyle Procedure Date: 03/17/2023 1:46 PM MRN: [id number] Account #: [id number] Date of Birth: 07-04-1976 Admit Type: Outpatient Age: 47 Room: [facility] ENDO ROOM 1 Gender: Male Note Status: Finalized Instrument Name: Peds Colonoscope [id number] Procedure:             Colonoscopy Indications:           High risk colon cancer surveillance: Personal history                         of colonic polyps, Iron deficiency anemia Providers:             Trenda Moots, DO Referring MD:          Danella Penton, MD (Referring MD) Medicines:             Monitored Anesthesia Care Complications:         No immediate complications. Estimated blood loss:                         Minimal. Procedure:             Pre-Anesthesia Assessment:                        - Prior to the procedure, a History and Physical was                         performed, and patient medications and allergies were                         reviewed. The patient is competent. The risks and                         benefits of the procedure and the sedation options and                         risks were discussed with the patient. All questions                         were answered and informed consent was obtained.                         Patient identification and proposed procedure were                         verified by the physician, the nurse, the anesthetist                         and the technician in the endoscopy suite. Mental                         Status Examination: alert and oriented. Airway                         Examination: normal oropharyngeal airway and neck                         mobility. Respiratory Examination: clear to  auscultation. CV Examination: RRR, no murmurs, no S3                         or S4. Prophylactic Antibiotics: The patient does not                         require prophylactic antibiotics.  Prior                         Anticoagulants: The patient has taken no anticoagulant                         or antiplatelet agents. ASA Grade Assessment: III - A                         patient with severe systemic disease. After reviewing                         the risks and benefits, the patient was deemed in                         satisfactory condition to undergo the procedure. The                         anesthesia plan was to use monitored anesthesia care                         (MAC). Immediately prior to administration of                         medications, the patient was re-assessed for adequacy                         to receive sedatives. The heart rate, respiratory                         rate, oxygen saturations, blood pressure, adequacy of                         pulmonary ventilation, and response to care were                         monitored throughout the procedure. The physical                         status of the patient was re-assessed after the                         procedure.                        After obtaining informed consent, the colonoscope was                         passed under direct vision. Throughout the procedure,                         the patient's blood pressure, pulse, and oxygen  saturations were monitored continuously. The                         Colonoscope was introduced through the anus and                         advanced to the the terminal ileum, with                         identification of the appendiceal orifice and IC                         valve. The colonoscopy was performed without                         difficulty. The patient tolerated the procedure well.                         The quality of the bowel preparation was evaluated                         using the BBPS [facility] Bowel Preparation Scale) with                         scores of: Right Colon = 2 (minor amount of residual                          staining, small fragments of stool and/or opaque                         liquid, but mucosa seen well), Transverse Colon = 2                         (minor amount of residual staining, small fragments of                         stool and/or opaque liquid, but mucosa seen well) and                         Left Colon = 3 (entire mucosa seen well with no                         residual staining, small fragments of stool or opaque                         liquid). The total BBPS score equals 7. The quality of                         the bowel preparation was good. The terminal ileum,                         ileocecal valve, appendiceal orifice, and rectum were                         photographed. Findings:      The perianal and digital rectal examinations were normal. Pertinent       negatives include normal sphincter  tone.      The terminal ileum appeared normal. Estimated blood loss: none.      A 1 to 2 mm polyp was found in the descending colon. The polyp was       sessile. The polyp was removed with a jumbo cold forceps. Resection and       retrieval were complete. Estimated blood loss was minimal.      Non-bleeding internal hemorrhoids were found during retroflexion. The       hemorrhoids were Grade I (internal hemorrhoids that do not prolapse).       Estimated blood loss: none.      The colon (entire examined portion) was mildly redundant. Estimated       blood loss: none.      The exam was otherwise without abnormality on direct and retroflexion       views. Impression:            - The examined portion of the ileum was normal.                        - One 1 to 2 mm polyp in the descending colon, removed                         with a jumbo cold forceps. Resected and retrieved.                        - Non-bleeding internal hemorrhoids.                        - Redundant colon.                        - The examination was otherwise normal on direct and                          retroflexion views. Recommendation:        - Patient has a contact number available for                         emergencies. The signs and symptoms of potential                         delayed complications were discussed with the patient.                         Return to normal activities tomorrow. Written                         discharge instructions were provided to the patient.                        - Discharge patient to home.                        - Resume previous diet.                        - Continue present medications.                        - Await pathology results.                        -  Repeat colonoscopy for surveillance based on                         pathology results.                        - Return to referring physician as previously                         scheduled.                        - The findings and recommendations were discussed with                         the patient. Procedure Code(s):     --- Professional ---                        [phone], Colonoscopy, flexible; with biopsy, single or                         multiple Diagnosis Code(s):     --- Professional ---                        Z86.010, Personal history of colonic polyps                        K64.0, First degree hemorrhoids                        D12.4, Benign neoplasm of descending colon                        Q43.8, Other specified congenital malformations of                         intestine CPT copyright 2022 American Medical Association. All rights reserved. The codes documented in this report are preliminary and upon coder review may  be revised to meet current compliance requirements. Attending Participation:      I personally performed the entire procedure. Elfredia Nevins, DO Jaynie Collins DO, DO 03/17/2023 2:34:24 PM This report has been signed electronically. Number of Addenda: 0 Note Initiated On: 03/17/2023 1:46 PM Scope Withdrawal Time: 0 hours 16 minutes 26 seconds   Total Procedure Duration: 0 hours 23 minutes 26 seconds  Estimated Blood Loss:  Estimated blood loss was minimal.      [facility]

## 2023-03-18 ENCOUNTER — Encounter: Payer: Self-pay | Admitting: Gastroenterology

## 2023-03-21 LAB — SURGICAL PATHOLOGY

## 2023-05-11 ENCOUNTER — Inpatient Hospital Stay: Payer: 59

## 2023-05-11 MED FILL — Iron Sucrose Inj 20 MG/ML (Fe Equiv): INTRAVENOUS | Qty: 10 | Status: AC

## 2023-05-12 ENCOUNTER — Inpatient Hospital Stay: Payer: 59 | Admitting: Oncology

## 2023-05-12 ENCOUNTER — Inpatient Hospital Stay: Payer: 59
